# Patient Record
Sex: Male | Born: 1980 | Race: White | Hispanic: No | Marital: Married | Smoking: Never smoker
Health system: Southern US, Community
[De-identification: ages and names within clinical notes are randomized; demographics above are authoritative.]

## PROBLEM LIST (undated history)

## (undated) DIAGNOSIS — I10 Essential (primary) hypertension: Secondary | ICD-10-CM

## (undated) DIAGNOSIS — I509 Heart failure, unspecified: Secondary | ICD-10-CM

## (undated) DIAGNOSIS — Z95810 Presence of automatic (implantable) cardiac defibrillator: Secondary | ICD-10-CM

## (undated) DIAGNOSIS — Q203 Discordant ventriculoarterial connection: Secondary | ICD-10-CM

## (undated) HISTORY — PX: CHOLECYSTECTOMY: SHX55

## (undated) HISTORY — PX: CARDIAC DEFIBRILLATOR PLACEMENT: SHX171

---

## 1998-08-21 ENCOUNTER — Emergency Department (HOSPITAL_COMMUNITY): Admission: EM | Admit: 1998-08-21 | Discharge: 1998-08-21 | Payer: Self-pay | Admitting: Emergency Medicine

## 2001-07-04 ENCOUNTER — Emergency Department (HOSPITAL_COMMUNITY): Admission: EM | Admit: 2001-07-04 | Discharge: 2001-07-04 | Payer: Self-pay | Admitting: *Deleted

## 2002-08-10 ENCOUNTER — Ambulatory Visit (HOSPITAL_COMMUNITY): Admission: RE | Admit: 2002-08-10 | Discharge: 2002-08-10 | Payer: Self-pay | Admitting: Cardiovascular Disease

## 2002-08-10 ENCOUNTER — Encounter: Payer: Self-pay | Admitting: Cardiovascular Disease

## 2002-11-10 ENCOUNTER — Encounter: Payer: Self-pay | Admitting: Emergency Medicine

## 2002-11-10 ENCOUNTER — Emergency Department (HOSPITAL_COMMUNITY): Admission: EM | Admit: 2002-11-10 | Discharge: 2002-11-10 | Payer: Self-pay | Admitting: Emergency Medicine

## 2004-06-29 ENCOUNTER — Observation Stay (HOSPITAL_COMMUNITY): Admission: AD | Admit: 2004-06-29 | Discharge: 2004-06-30 | Payer: Self-pay | Admitting: General Surgery

## 2004-06-29 ENCOUNTER — Emergency Department (HOSPITAL_COMMUNITY): Admission: EM | Admit: 2004-06-29 | Discharge: 2004-06-29 | Payer: Self-pay | Admitting: Emergency Medicine

## 2005-12-12 ENCOUNTER — Emergency Department (HOSPITAL_COMMUNITY): Admission: EM | Admit: 2005-12-12 | Discharge: 2005-12-12 | Payer: Self-pay | Admitting: Emergency Medicine

## 2006-01-02 ENCOUNTER — Emergency Department (HOSPITAL_COMMUNITY): Admission: EM | Admit: 2006-01-02 | Discharge: 2006-01-02 | Payer: Self-pay | Admitting: Emergency Medicine

## 2006-03-03 ENCOUNTER — Other Ambulatory Visit: Payer: Self-pay

## 2006-03-03 ENCOUNTER — Emergency Department: Payer: Self-pay | Admitting: Emergency Medicine

## 2006-07-18 ENCOUNTER — Encounter (HOSPITAL_COMMUNITY): Admission: RE | Admit: 2006-07-18 | Discharge: 2006-08-17 | Payer: Self-pay | Admitting: Pediatrics

## 2006-08-22 ENCOUNTER — Encounter (HOSPITAL_COMMUNITY): Admission: RE | Admit: 2006-08-22 | Discharge: 2006-09-21 | Payer: Self-pay | Admitting: Pediatrics

## 2006-09-23 ENCOUNTER — Encounter (HOSPITAL_COMMUNITY): Admission: RE | Admit: 2006-09-23 | Discharge: 2006-09-26 | Payer: Self-pay | Admitting: Pediatrics

## 2006-09-28 ENCOUNTER — Encounter (HOSPITAL_COMMUNITY): Admission: RE | Admit: 2006-09-28 | Discharge: 2006-10-28 | Payer: Self-pay | Admitting: Pediatrics

## 2006-11-14 ENCOUNTER — Encounter (HOSPITAL_COMMUNITY): Admission: RE | Admit: 2006-11-14 | Discharge: 2006-12-14 | Payer: Self-pay | Admitting: Pediatrics

## 2006-12-12 ENCOUNTER — Encounter (HOSPITAL_COMMUNITY): Admission: RE | Admit: 2006-12-12 | Discharge: 2007-01-11 | Payer: Self-pay | Admitting: Pediatrics

## 2007-06-05 ENCOUNTER — Encounter (HOSPITAL_COMMUNITY): Admission: RE | Admit: 2007-06-05 | Discharge: 2007-06-27 | Payer: Self-pay | Admitting: Pediatrics

## 2008-01-15 ENCOUNTER — Encounter (HOSPITAL_COMMUNITY): Admission: RE | Admit: 2008-01-15 | Discharge: 2008-02-14 | Payer: Self-pay | Admitting: Pediatrics

## 2009-01-24 ENCOUNTER — Emergency Department (HOSPITAL_COMMUNITY): Admission: EM | Admit: 2009-01-24 | Discharge: 2009-01-24 | Payer: Self-pay | Admitting: Emergency Medicine

## 2009-02-17 ENCOUNTER — Encounter (HOSPITAL_COMMUNITY): Admission: RE | Admit: 2009-02-17 | Discharge: 2009-03-19 | Payer: Self-pay | Admitting: Pediatrics

## 2011-02-12 NOTE — Consult Note (Signed)
Brett Marquez, Brett Marquez                ACCOUNT NO.:  [id number]   MEDICAL RECORD NO.:  [id number]          PATIENT TYPE:  INP   LOCATION:  A215                          FACILITY:  [facility]   PHYSICIAN:  Edward L. Juanetta Gosling, M.D.DATE OF BIRTH:  1980-10-01   DATE OF CONSULTATION:  DATE OF DISCHARGE:                                   CONSULTATION   REASON FOR CONSULTATION:  Cardiac disease.   HISTORY:  Brett Marquez is a 30 year old who has a long, long history of cardiac  problems.  He is actually admitted now for right upper quadrant pain,  cholecystitis, and cholelithiasis and is being set up for surgery.  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  He has had a pacemaker  implanted.  He has a duo-chamber pacemaker at this time.   PAST MEDICAL HISTORY:  1.  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  2.  Hypertension.  3.  He has had a pacemaker for many years.  He has had a recent battery      replacement.   MEDICATIONS:  His only medication is Lotensin 10 mg daily.   ALLERGIES:  No known drug allergies.   FAMILY HISTORY:  Positive for apparently some cardiac disease but not other  congenital heart abnormalities.   REVIEW OF SYSTEMS:  Except as mentioned is essentially negative.  He has had  his pacemaker checked.  He does not have any nausea.  He has had some  abdominal discomfort as mentioned, because of his cholecystitis.   PHYSICAL EXAMINATION:  GENERAL:  He is a well-developed well-nourished male  who is moderately obese and in no acute distress.  He does complain of some  right upper quadrant pain.  VITAL SIGNS:  Temperature is 97.8, pulse is 78, respirations 20, blood  pressure 128/74.  Height 73 inches, weight 277.  CHEST:  Clear without wheezes.  HEART:  Regular in what appears to be a paced rhythm.  ABDOMEN:  Soft.  EXTREMITIES:  Showed  no edema.   LABORATORY DATA:  White count 7700, hemoglobin is 13.8, hematocrit 40.2.  His electrolytes are normal.  Albumin is 3.4, amylase 63.  Urine is  negative.  He has had an ultrasound, which shows cholecystitis and  cholelithiasis.   ASSESSMENT:  1.  He has cholecystitis and cholelithiasis.  2.  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  3.  Hypertension.   PLAN:  He is okay for the planned surgery.     Edwa   ELH/MEDQ  D:  06/30/2004  T:  06/30/2004  Job:  [id number]

## 2011-02-12 NOTE — Op Note (Signed)
Brett Marquez, Brett Marquez                ACCOUNT NO.:  [id number]   MEDICAL RECORD NO.:  [id number]          PATIENT TYPE:  INP   LOCATION:  A215                          FACILITY:  [facility]   PHYSICIAN:  Dalia Heading, M.D.  DATE OF BIRTH:  08-May-1981   DATE OF PROCEDURE:  06/30/2004  DATE OF DISCHARGE:                                 OPERATIVE REPORT   PREOPERATIVE DIAGNOSIS:  Cholecystitis, cholelithiasis.   POSTOPERATIVE DIAGNOSIS:  Cholecystitis, cholelithiasis.   PROCEDURE:  Laparoscopic cholecystectomy.   SURGEON:  Dalia Heading, M.D.   ASSISTANT:  Bernerd Limbo. Leona Carry, M.D.   ANESTHESIA:  General endotracheal.   INDICATIONS:  The patient is a 30 year old white male who presents with  cholecystitis secondary to cholelithiasis.  The risks and benefits of the  procedure, including bleeding, infection, hepatobiliary injury, and the  possibility of an open procedure, were fully explained to the patient, who  gave informed consent.   PROCEDURE NOTE:  The patient was placed in the supine position.  After  induction of general endotracheal anesthesia, the abdomen was prepped and  draped using the usual sterile technique with Betadine.  Surgical site  confirmation was performed.   A supraumbilical incision was made down to the fascia.  A Veress needle was  introduced into the abdominal cavity and confirmation of placement was done  using the saline drop test.  The abdomen was then insufflated to 16 mmHg  pressure.  An 11 mm trocar was introduced into the abdominal cavity under  direct visualization without difficulty.  An additional 11 mm trocar was  placed in the epigastric region and 5 mm trocars were placed in the right  upper quadrant and right flank regions.  The liver was inspected and noted  to be within normal limits.  The gallbladder was retracted superior and  laterally.  The dissection was begun around the infundibulum of the  gallbladder.  The cystic duct was first  identified, its juncture to the  infundibulum fully identified.  Endoclips were placed proximally and  distally on the cystic duct and the cystic duct was divided.  This was  likewise done with the cystic artery.  The gallbladder was then freed away  from the gallbladder fossa using Bovie electrocautery.  The gallbladder was  delivered through the epigastric trocar site using an EndoCatch bag.  The  gallbladder fossa was inspected and no abnormal bleeding or bile leakage was  noted.  Surgicel was placed in the gallbladder fossa.  All fluid and air  were then evacuated from the abdominal cavity prior to the removal of the  trocars.   All wounds were irrigated with normal saline.  All wounds were injected with  0.5% Sensorcaine.  The supraumbilical fascia as well as epigastric fascia  were reapproximated using an 0 Vicryl interrupted suture.  All skin  incisions were closed using staples.  Betadine ointment and dry sterile  dressings were applied.   All tape and needle counts were correct at the end of the procedure.  The  patient was extubated in the operating room and went back to  the recovery  room awake, in stable condition.   COMPLICATIONS:  None.   SPECIMENS:  Gallbladder with stones.   ESTIMATED BLOOD LOSS:  Minimal.      MAJ/MEDQ  D:  06/30/2004  T:  06/30/2004  Job:  [id number]   cc:   Ramon Dredge L. Juanetta Gosling, M.D.  [address]  [place]  [place] [address]  Fax: [phone]

## 2011-02-12 NOTE — H&P (Signed)
NAMEBRYLER, Brett Marquez                ACCOUNT NO.:  [id number]   MEDICAL RECORD NO.:  [id number]          PATIENT TYPE:  OUT   LOCATION:  RAD                           FACILITY:  [facility]   PHYSICIAN:  Dalia Heading, M.D.  DATE OF BIRTH:  09-05-1981   DATE OF ADMISSION:  06/29/2004  DATE OF DISCHARGE:  LH                                HISTORY & PHYSICAL   CHIEF COMPLAINT:  1.  Cholecystitis.  2.  Cholelithiasis.   HISTORY OF PRESENT ILLNESS:  The patient is a 30 year old white male who was  in his usual state of health when he presented to the emergency room late  last night with right upper quadrant abdominal pain.  He underwent an  ultrasound of the gallbladder today which reveals acute cholecystitis with  gallbladder wall thickening and cholelithiasis.  The common bile duct was  reportedly within normal limits.  The patient now presents to the hospital  for intravenous antibiotics and preparation for surgery.   PAST MEDICAL HISTORY:  1.  Transposition of the great vessels.  2.  Hypertension.   PAST SURGICAL HISTORY:  1.  Correction of his transposition of the great vessels as an infant.  2.  Recent pacemaker battery placement approximately two years ago, dual      chamber lead.   CURRENT MEDICATIONS:  Lotensin 10 mg p.o. daily.   ALLERGIES:  No known drug allergies.   REVIEW OF SYSTEMS:  The patient states that he did a phone check of his  pacemaker one month ago and it was reportedly normal.  He is followed by Dr.  Juanetta Gosling and [facility].   PHYSICAL EXAMINATION:  GENERAL APPEARANCE:  The patient is a well-developed,  well-nourished, white male in no acute distress.  HEENT:  Examination reveals no scleral icterus.  LUNGS:  Clear to auscultation with equal breath sounds bilaterally.  HEART:  Regular rate and rhythm without S3, S4 or murmurs.  ABDOMEN:  Soft with tenderness down the right upper quadrant to palpation.  No hepatosplenomegaly, masses or hernias  are identified.   IMPRESSION:  1.  Cholecystitis.  2.  Cholelithiasis.   PLAN:  The patient will be admitted to the hospital for intravenous  antibiotic therapy and further workup.  He is to undergo laparoscopic  cholecystectomy in the a.m.  The risks and benefits of the procedure,  including bleeding, infection, hepatobiliary injury and the possibility of  an open procedure were fully explained to the patient, who gave informed  consent.      MAJ/MEDQ  D:  06/29/2004  T:  06/29/2004  Job:  [id number]   cc:   Ramon Dredge L. Juanetta Gosling, M.D.  [address]  [place]  [place] [address]  Fax: [phone]

## 2011-08-10 DIAGNOSIS — I484 Atypical atrial flutter: Secondary | ICD-10-CM | POA: Insufficient documentation

## 2011-08-10 DIAGNOSIS — I441 Atrioventricular block, second degree: Secondary | ICD-10-CM | POA: Insufficient documentation

## 2011-08-10 DIAGNOSIS — Z95810 Presence of automatic (implantable) cardiac defibrillator: Secondary | ICD-10-CM | POA: Insufficient documentation

## 2014-05-07 DIAGNOSIS — I509 Heart failure, unspecified: Secondary | ICD-10-CM | POA: Insufficient documentation

## 2014-09-09 ENCOUNTER — Emergency Department (HOSPITAL_COMMUNITY): Payer: Medicare Other

## 2014-09-09 ENCOUNTER — Encounter (HOSPITAL_COMMUNITY): Payer: Self-pay | Admitting: Emergency Medicine

## 2014-09-09 ENCOUNTER — Emergency Department (HOSPITAL_COMMUNITY)
Admission: EM | Admit: 2014-09-09 | Discharge: 2014-09-09 | Disposition: A | Discharge status: Home / Self Care | Payer: Medicare Other | Attending: Emergency Medicine | Admitting: Emergency Medicine

## 2014-09-09 DIAGNOSIS — Z95810 Presence of automatic (implantable) cardiac defibrillator: Secondary | ICD-10-CM | POA: Diagnosis not present

## 2014-09-09 DIAGNOSIS — R079 Chest pain, unspecified: Secondary | ICD-10-CM | POA: Diagnosis present

## 2014-09-09 DIAGNOSIS — R071 Chest pain on breathing: Secondary | ICD-10-CM | POA: Insufficient documentation

## 2014-09-09 DIAGNOSIS — R52 Pain, unspecified: Secondary | ICD-10-CM

## 2014-09-09 HISTORY — DX: Presence of automatic (implantable) cardiac defibrillator: Z95.810

## 2014-09-09 HISTORY — DX: Discordant ventriculoarterial connection: Q20.3

## 2014-09-09 LAB — COMPREHENSIVE METABOLIC PANEL
ALT: 42 U/L (ref 0–53)
AST: 34 U/L (ref 0–37)
Albumin: 3.9 g/dL (ref 3.5–5.2)
Alkaline Phosphatase: 71 U/L (ref 39–117)
Anion gap: 12 (ref 5–15)
BUN: 12 mg/dL (ref 6–23)
CALCIUM: 9.7 mg/dL (ref 8.4–10.5)
CO2: 28 meq/L (ref 19–32)
Chloride: 102 mEq/L (ref 96–112)
Creatinine, Ser: 0.86 mg/dL (ref 0.50–1.35)
GLUCOSE: 103 mg/dL — AB (ref 70–99)
Potassium: 4.4 mEq/L (ref 3.7–5.3)
Sodium: 142 mEq/L (ref 137–147)
Total Bilirubin: 0.5 mg/dL (ref 0.3–1.2)
Total Protein: 7.5 g/dL (ref 6.0–8.3)

## 2014-09-09 LAB — CBC WITH DIFFERENTIAL/PLATELET
BASOS ABS: 0 10*3/uL (ref 0.0–0.1)
Basophils Relative: 0 % (ref 0–1)
EOS PCT: 7 % — AB (ref 0–5)
Eosinophils Absolute: 0.5 10*3/uL (ref 0.0–0.7)
HCT: 42 % (ref 39.0–52.0)
Hemoglobin: 14.7 g/dL (ref 13.0–17.0)
LYMPHS ABS: 2.3 10*3/uL (ref 0.7–4.0)
Lymphocytes Relative: 38 % (ref 12–46)
MCH: 29.9 pg (ref 26.0–34.0)
MCHC: 35 g/dL (ref 30.0–36.0)
MCV: 85.4 fL (ref 78.0–100.0)
MONO ABS: 0.5 10*3/uL (ref 0.1–1.0)
Monocytes Relative: 9 % (ref 3–12)
Neutro Abs: 2.8 10*3/uL (ref 1.7–7.7)
Neutrophils Relative %: 46 % (ref 43–77)
PLATELETS: 177 10*3/uL (ref 150–400)
RBC: 4.92 MIL/uL (ref 4.22–5.81)
RDW: 13.3 % (ref 11.5–15.5)
WBC: 6.1 10*3/uL (ref 4.0–10.5)

## 2014-09-09 LAB — TROPONIN I

## 2014-09-09 LAB — D-DIMER, QUANTITATIVE (NOT AT ARMC)

## 2014-09-09 MED ORDER — TRAMADOL HCL 50 MG PO TABS: ORAL_TABLET | ORAL | Status: DC

## 2014-09-09 MED ORDER — IOHEXOL 300 MG/ML  SOLN
100.0000 mL | Freq: Once | INTRAMUSCULAR | Status: AC | PRN
  Administered 2014-09-09: 100 mL via INTRAVENOUS

## 2014-09-09 MED ORDER — MORPHINE SULFATE 4 MG/ML IJ SOLN
4.0000 mg | Freq: Once | INTRAMUSCULAR | Status: AC
  Administered 2014-09-09: 4 mg via INTRAVENOUS
  Filled 2014-09-09: qty 1

## 2014-09-09 NOTE — ED Notes (Signed)
Pt reports onset of L sided chest pain. Pt describes pain as "sharp, tearing pain." Pt has hx of defibrillator implant.

## 2014-09-09 NOTE — ED Provider Notes (Signed)
CSN: [id number]     Arrival date & time 09/09/14  [id number] History   This chart was scribed for Brett LennertJoseph L Allyiah Gartner, MD by Brett LionSuzanne Marquez, ED Scribe. This patient was seen in room APA10/APA10 and the patient's care was started at 7:58 AM.    Chief Complaint  Patient presents with  . Chest Pain    Patient is a 33 y.o. male presenting with chest pain.  Chest Pain Pain location:  L chest Pain quality: sharp   Duration:  2 days Timing:  Intermittent Progression:  Unchanged Worsened by:  Deep breathing Associated symptoms: no abdominal pain, no back pain, no cough, no fatigue and no headache   Risk factors: surgery (defibrillator implant)    HPI Comments: Brett Marquez is a 33 y.o. male who presents to the Emergency Department complaining of intermittent, sharp, "stretchy" left-sided chest pain that will sometimes stop for 5-10 seconds at a time and began 2 days ago. Patient has a history of defibrillator implant and says his pain "feels like it's behind the defibrillator." Deep inspiration triggers the pain. [facility] who said that the pain could be due to scar tissue from patient's surgeries.   No past medical history on file. No past surgical history on file. No family history on file. History  Substance Use Topics  . Smoking status: Not on file  . Smokeless tobacco: Not on file  . Alcohol Use: Not on file    Review of Systems  Constitutional: Negative for appetite change and fatigue.  HENT: Negative for congestion, ear discharge and sinus pressure.   Eyes: Negative for discharge.  Respiratory: Negative for cough.   Cardiovascular: Positive for chest pain.  Gastrointestinal: Negative for abdominal pain and diarrhea.  Genitourinary: Negative for frequency and hematuria.  Musculoskeletal: Negative for back pain.  Skin: Negative for rash.  Neurological: Negative for seizures and headaches.  Psychiatric/Behavioral: Negative for hallucinations.      Allergies   Review of patient's allergies indicates not on file.  Home Medications   Prior to Admission medications   Not on File   BP 144/86 mmHg  Pulse 72  Temp(Src) 97.6 F (36.4 C) (Oral)  Resp 16  Ht 6\' 1"  (1.854 m)  Wt 300 lb (136.079 kg)  BMI 39.59 kg/m2  SpO2 98% Physical Exam  Constitutional: He is oriented to person, place, and time. He appears well-developed.  HENT:  Head: Normocephalic.  Eyes: Conjunctivae and EOM are normal. No scleral icterus.  Neck: Neck supple. No thyromegaly present.  Cardiovascular: Normal rate and regular rhythm.  Exam reveals no gallop and no friction rub.   No murmur heard. Pulmonary/Chest: No stridor. He has no wheezes. He has no rales. He exhibits no tenderness.  Abdominal: He exhibits no distension. There is no tenderness. There is no rebound.  Musculoskeletal: Normal range of motion. He exhibits no edema.  Lymphadenopathy:    He has no cervical adenopathy.  Neurological: He is oriented to person, place, and time. He exhibits normal muscle tone. Coordination normal.  Skin: No rash noted. No erythema.  Well-healed scar over left upper chest.  Psychiatric: He has a normal mood and affect. His behavior is normal.  Nursing note and vitals reviewed.   ED Course  Procedures (including critical care time)  DIAGNOSTIC STUDIES: Oxygen Saturation is 98% on RA, normal by my interpretation.    COORDINATION OF CARE: 8:00 AM - Discussed treatment plan with pt at bedside which includes several tests and  pt agreed to plan.   Labs Review Labs Reviewed - No data to display  Imaging Review No results found.   EKG Interpretation   Date/Time:  Monday September 09 2014 07:53:14 EST Ventricular Rate:  70 PR Interval:    QRS Duration: 167 QT Interval:  502 QTC Calculation: 542 R Axis:   -88 Text Interpretation:  Accelerated junctional rhythm Right bundle branch  block Probable inferior infarct, acute Anteroseptal infarct, age  indeterminate  Lateral leads are also involved Confirmed by Brett Soderman  MD,  Brett Marquez [phone]([id number]) on 09/09/2014 12:59:43 PM      MDM   Final diagnoses:  None   Noncardiac chest pain.   tx with ultram and follow up with pcp  The chart was scribed for me under my direct supervision.  I personally performed the history, physical, and medical decision making and all procedures in the evaluation of this patient.Brett Marquez.    Brett Weida L Max Nuno, MD 09/09/14 [id number]

## 2014-09-09 NOTE — Discharge Instructions (Signed)
Follow up with your md in one week for recheck °

## 2016-04-13 IMAGING — CR DG CHEST 2V
2 series · 2 of 2 positions shown · non-contrast
Comparison: 09/09/2014

CLINICAL DATA: Left chest/back pain, mild shortness of breath x1
day

EXAM:
CHEST  2 VIEW

[view not recorded (1 of 2)]
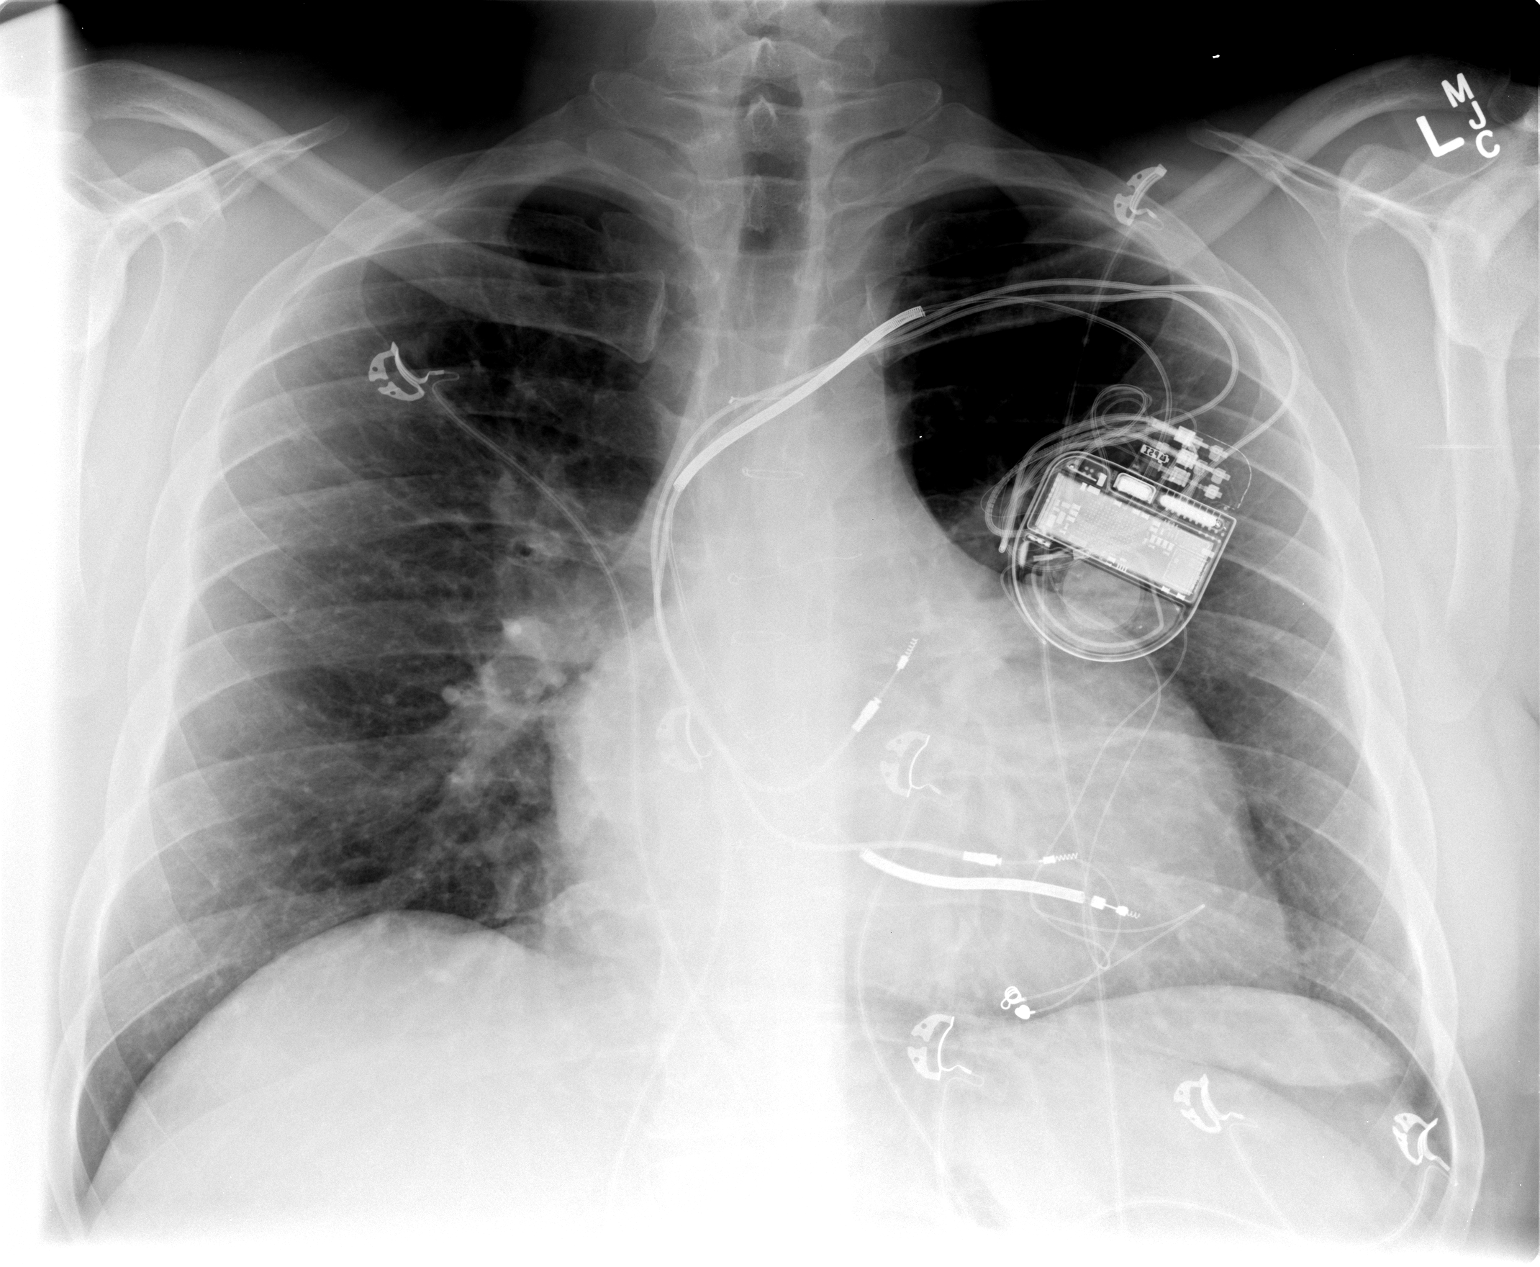

[view not recorded (2 of 2)]
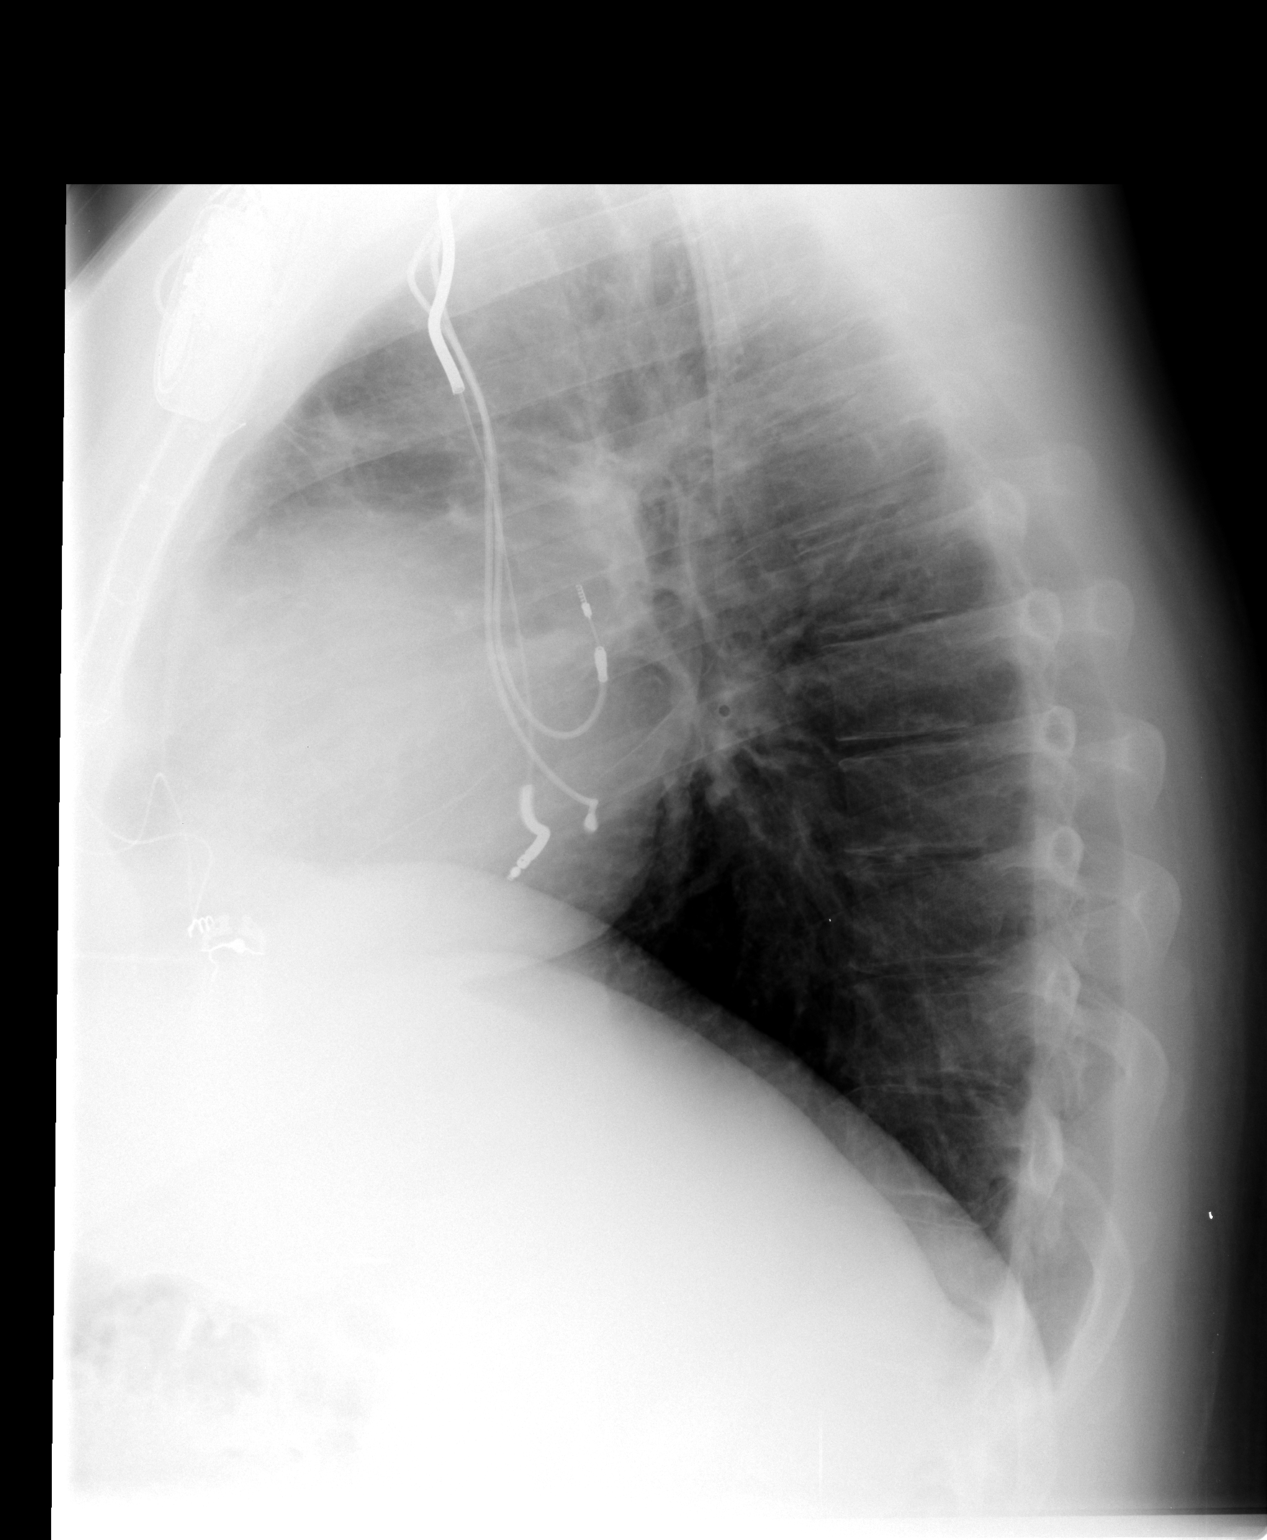

[2 of 2 positions shown; findings below may reference images not displayed]

FINDINGS: Mild patchy retrosternal opacity on the lateral view. Possible mild
central right upper lobe opacity on the frontal radiograph. Although
equivocal, this appearance at least raises the possibility of mild
right upper lobe pneumonia. No pleural effusion or pneumothorax.

Cardiomegaly.  Left subclavian ICD.

Visualized osseous structures are within normal limits.
IMPRESSION: Patchy retrosternal opacity on the lateral view, possibly reflecting
right upper lobe pneumonia.

## 2016-04-13 IMAGING — CR DG CHEST 1V PORT
1 series · 1 of 1 positions shown · non-contrast
Comparison: None.

CLINICAL DATA: One day history of left-sided chest and back pain
with shortness of breath; history of congenital heart disease;
implantable cardiac defibrillator.

EXAM:
PORTABLE CHEST - 1 VIEW

[portable]
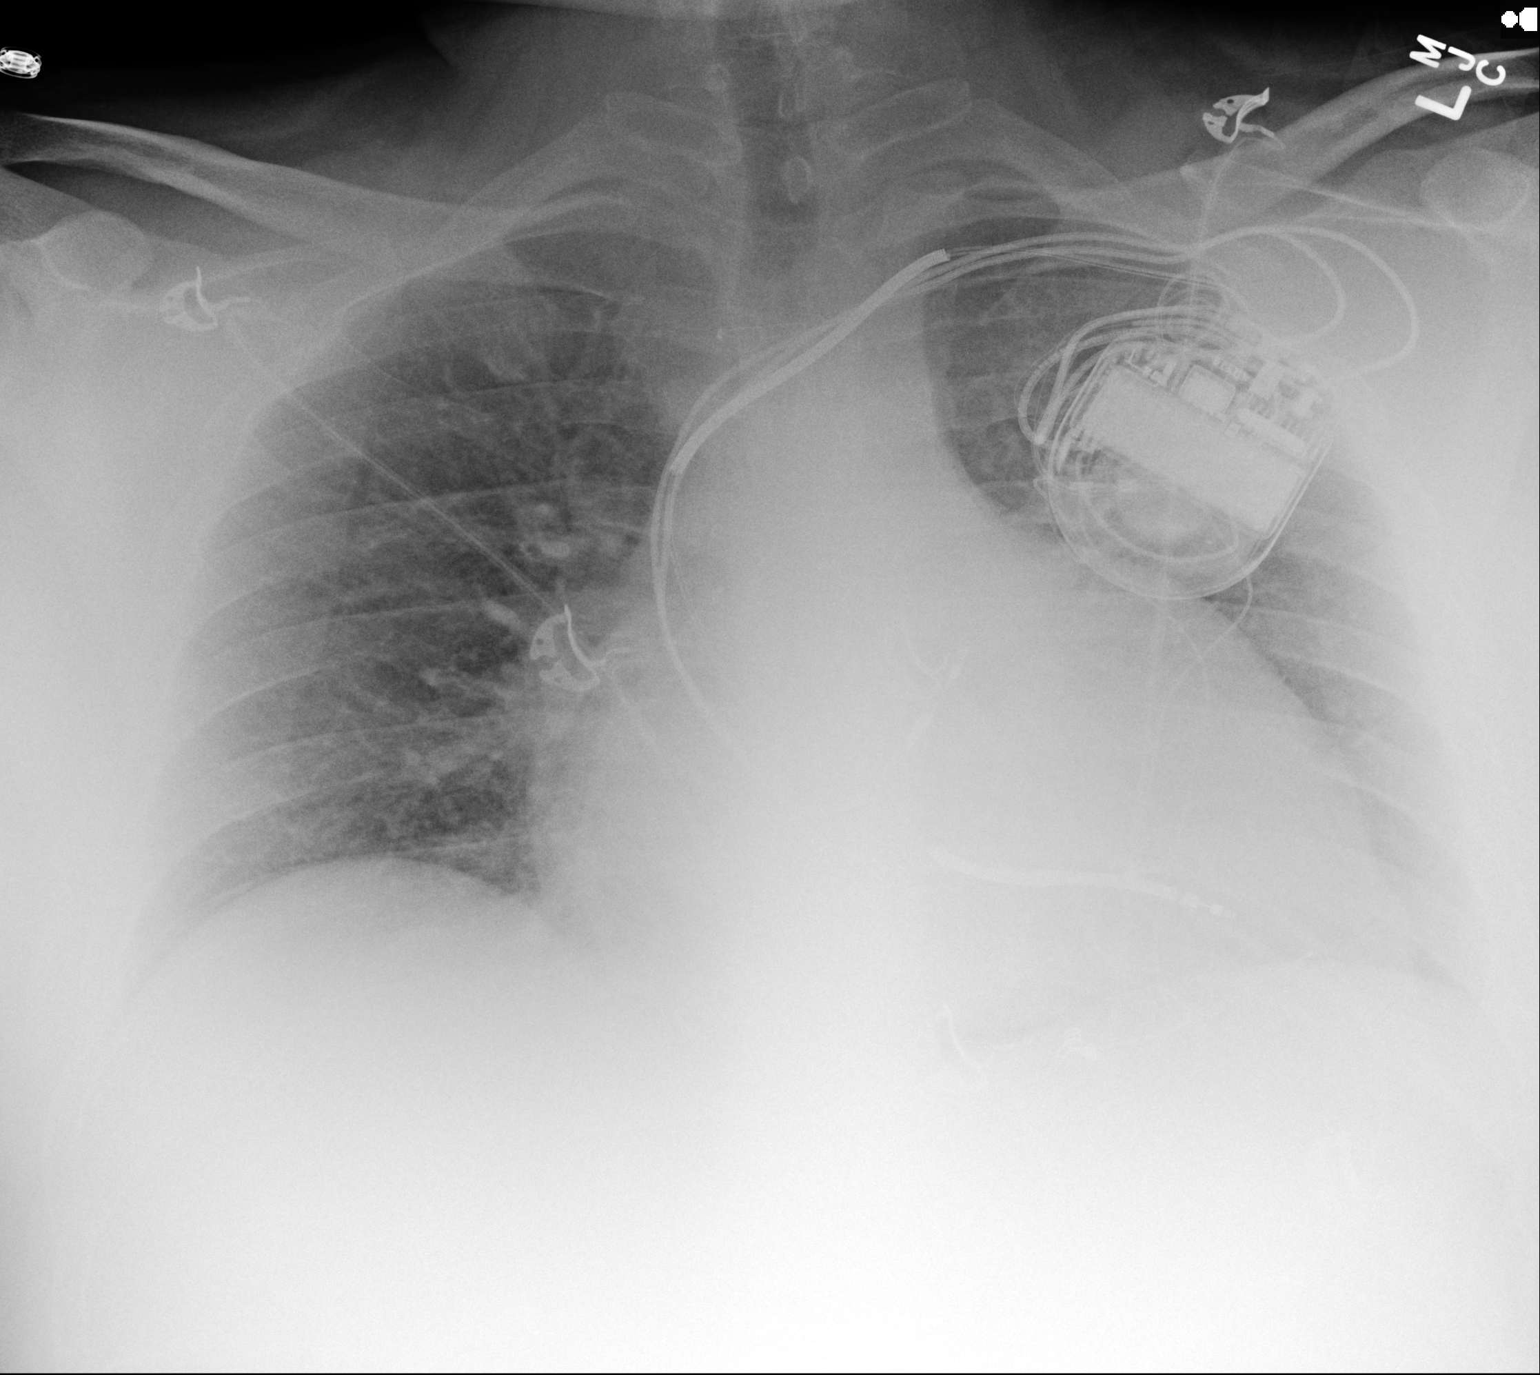

[1 of 1 positions shown; findings below may reference images not displayed]

FINDINGS: The cardiopericardial silhouette is enlarged. The pulmonary
interstitial markings are minimally prominent but the study is
limited due to bilateral hypoinflation. The pulmonary vascularity is
not clearly engorged. The trachea is midline. There is no pleural
effusion or pneumothorax. The permanent pacemaker/ defibrillator is
in appropriate position.
IMPRESSION: Enlargement of the cardiac silhouette may rib be related to chamber
enlargement muscular hypertrophy, or pericardial effusion. There is
no significant pulmonary edema. There is no evidence of pneumonia.

A followup PA and lateral chest x-ray with deep inspiration would be
useful.

## 2016-04-13 IMAGING — CT CT CHEST W/ CM
2 of 3 series · 15 of 36 positions shown, 18 images · IV contrast (omnipaque)
Comparison: Chest x-ray from earlier today. CT chest from
05/23/2013.

CLINICAL DATA: Subsequent encounter for left chest and back pain
with mild shortness of Breath lasting 1 day. Question right upper
lobe pneumonia on chest x-ray earlier today.

EXAM:
CT CHEST WITH CONTRAST
TECHNIQUE: Multidetector CT imaging of the chest was performed during
intravenous contrast administration.
CONTRAST:  100mL OMNIPAQUE IOHEXOL 300 MG/ML  SOLN

[Series 2: chestroutine 5.0 b40f · axial · 0.82mm/px · z∈[-396,-106]mm · 12 of 68 slices shown, 15 images]
[im 5/68  mediastinal]
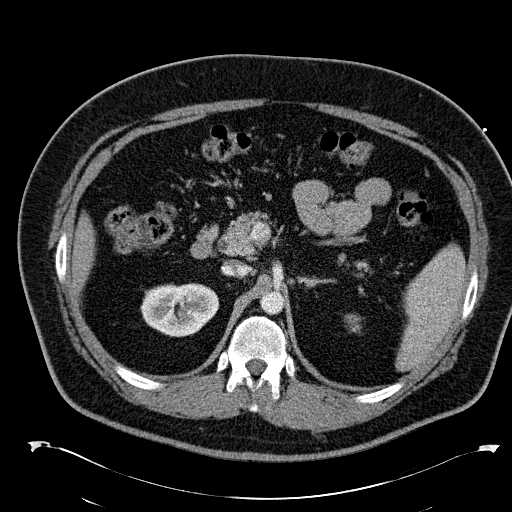
[im 5/68  lung]
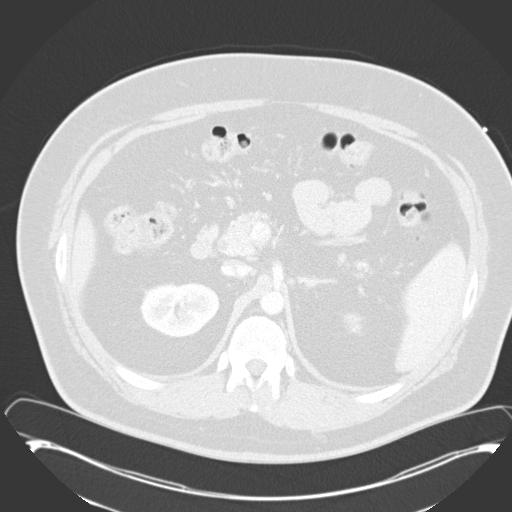
[im 10/68  lung]
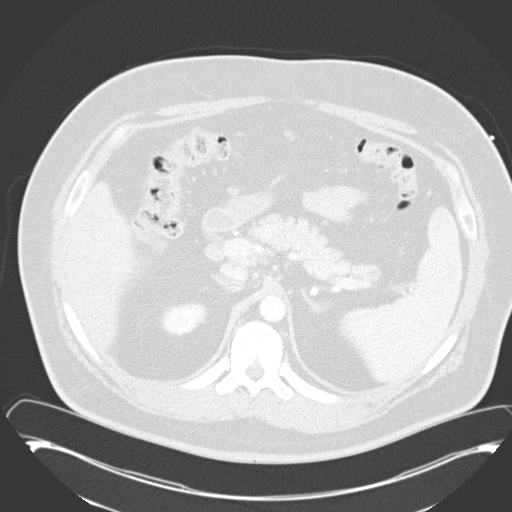
[im 15/68  lung]
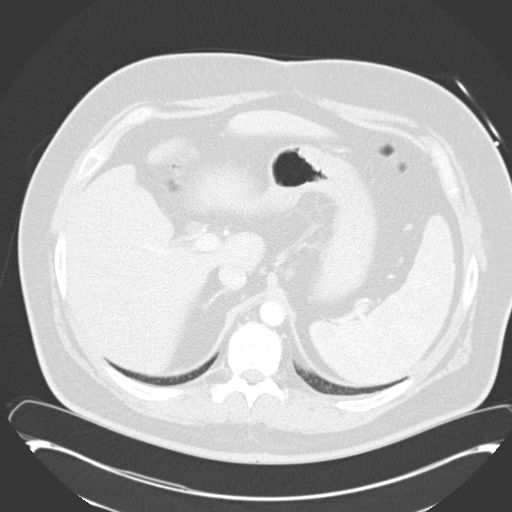
[im 20/68  lung]
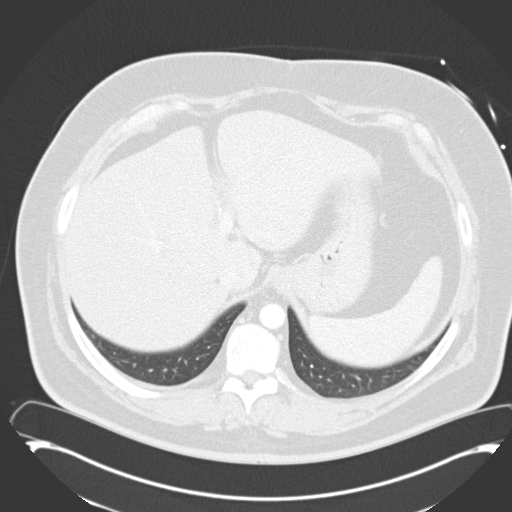
[im 25/68  mediastinal]
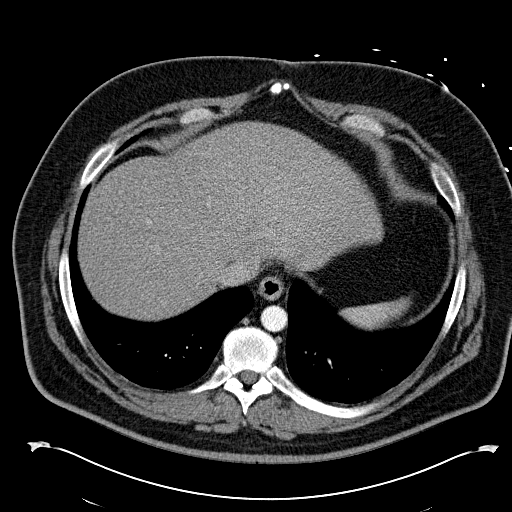
[im 25/68  lung]
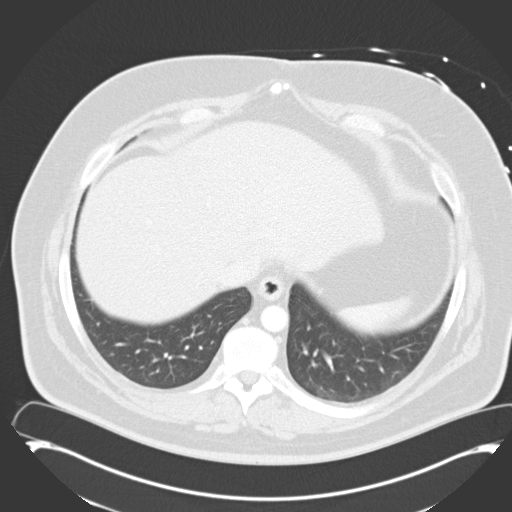
[im 30/68  lung]
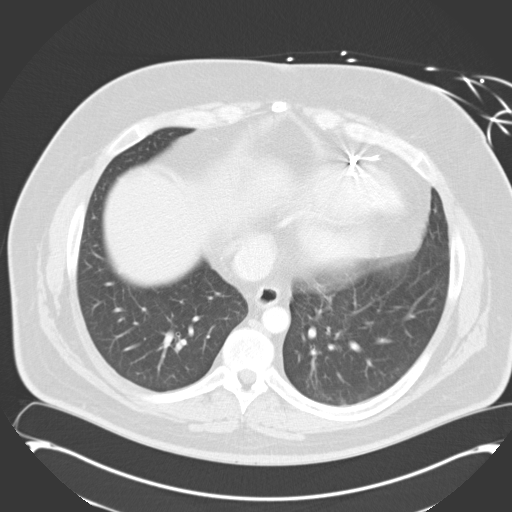
[im 38/68  lung]
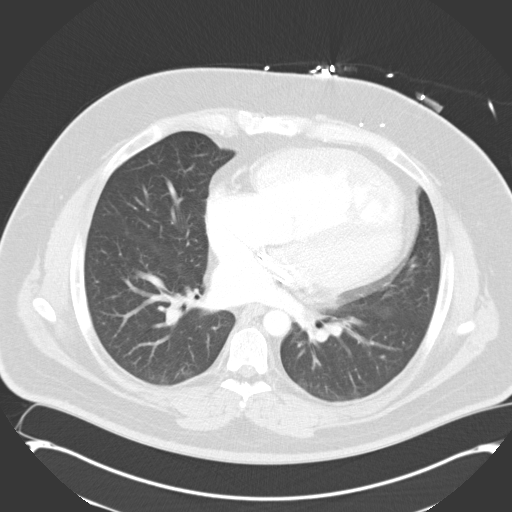
[im 43/68  lung]
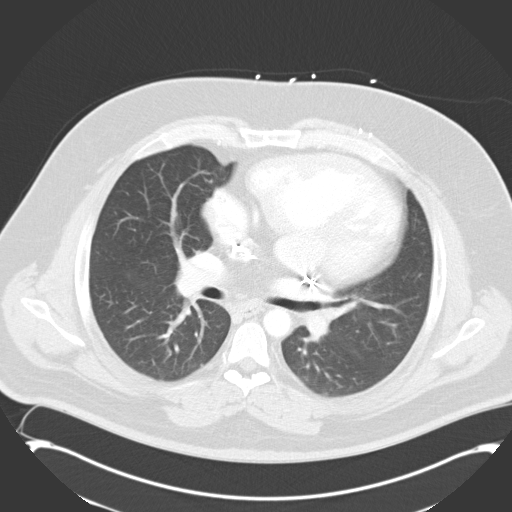
[im 48/68  mediastinal]
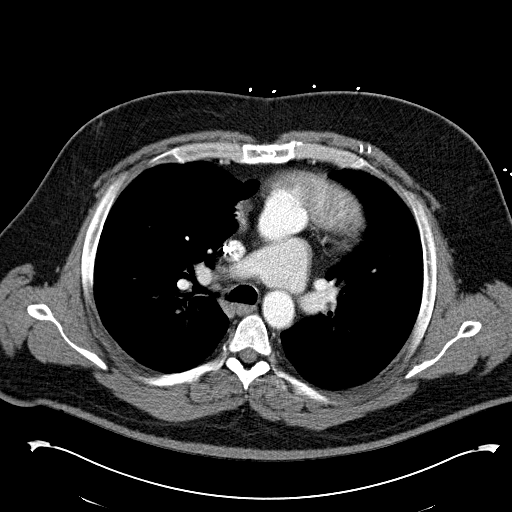
[im 48/68  lung]
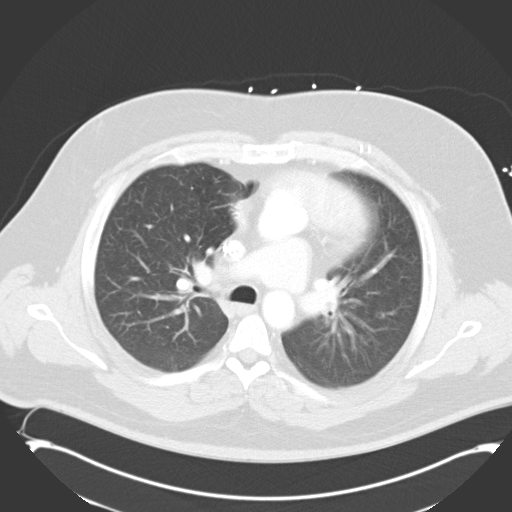
[im 53/68  lung]
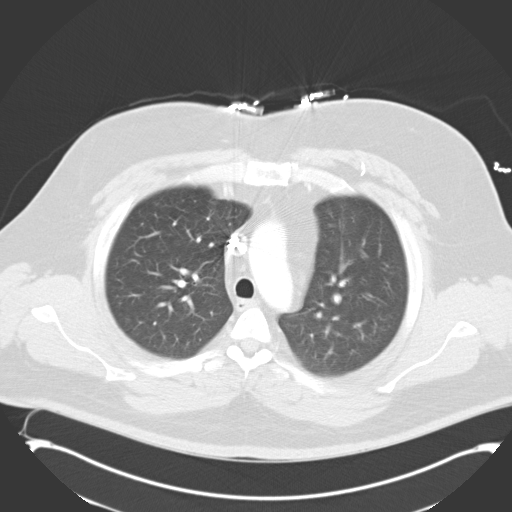
[im 58/68  lung]
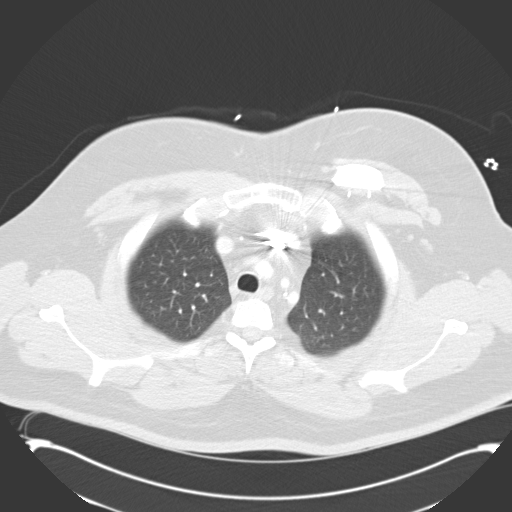
[im 63/68  lung]
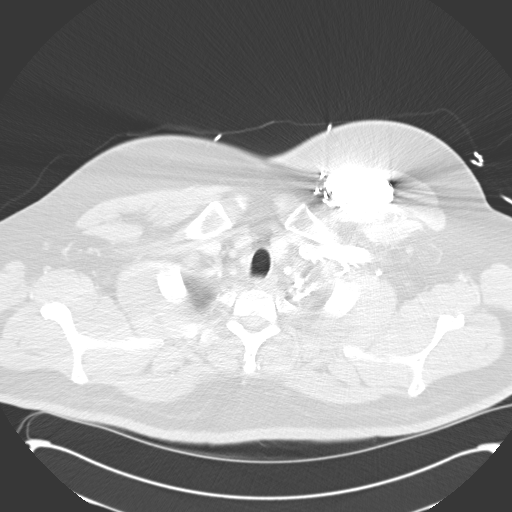

[Series 4: mpr coronal chest 3mm · coronal · 0.71mm/px · 3 of 106 slices shown]
[im 22/106  lung]
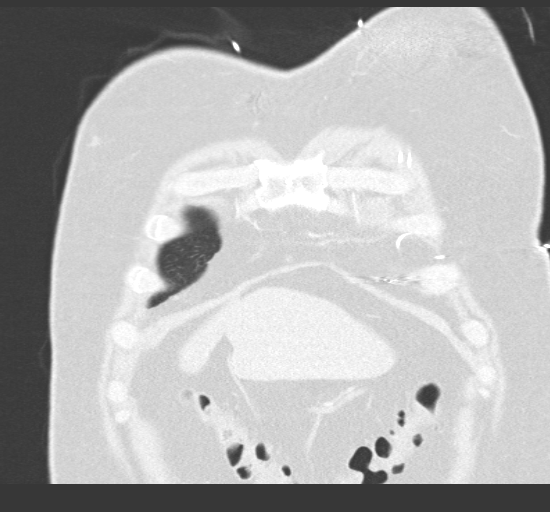
[im 43/106  lung]
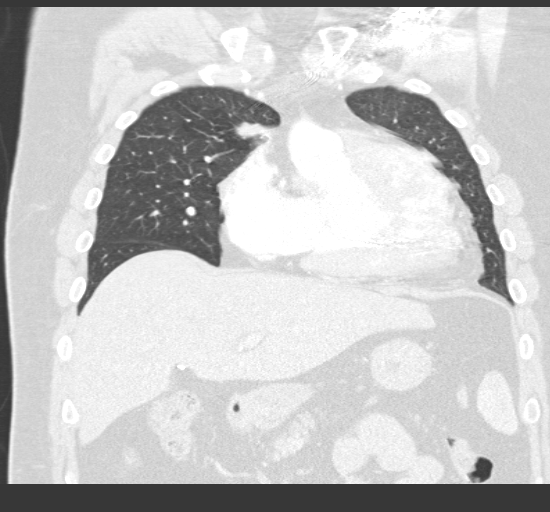
[im 64/106  lung]
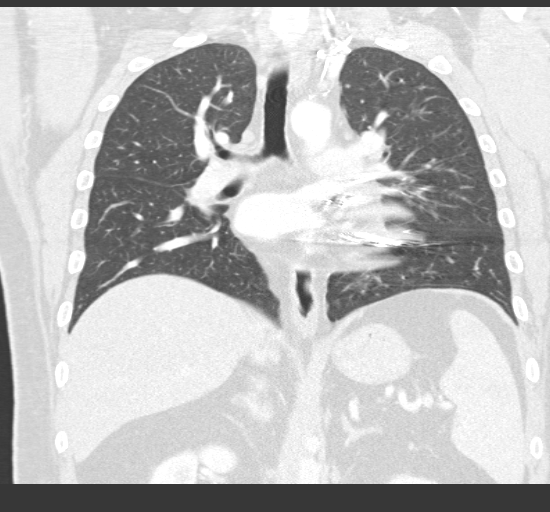

[15 of 36 positions shown; findings below may reference images not displayed]

FINDINGS: Soft tissue / Mediastinum: There is no axillary lymphadenopathy.
Small lymph nodes are seen in the mediastinum without pathologic
enlargement. There is no hilar lymphadenopathy. Cardiac and great
vessel anatomy consistent with reported history of transposition of
the great vessels. No pericardial effusion. Left-sided permanent
pacemaker identified.

Lungs / Pleura: Focal area of apparent peripheral scarring is seen
in the medial right upper lobe, extending to the medial pleura.
Right lung is otherwise clear. 5 mm lingular nodule is seen on image
24. Left lung otherwise clear. No pleural effusion on in the solid.

Bones: Bone windows reveal no worrisome lytic or sclerotic osseous
lesions.

Upper Abdomen:  Unremarkable
IMPRESSION: Chest x-ray abnormality from earlier today represents an area of
apparent focal scarring in the medial right upper lobe, as the
appearance is completely stable since 05/23/2013.

Otherwise unremarkable exam.

## 2017-06-27 ENCOUNTER — Emergency Department (HOSPITAL_COMMUNITY)
Admission: EM | Admit: 2017-06-27 | Discharge: 2017-06-27 | Disposition: A | Discharge status: Home / Self Care | Payer: Medicare Other | Attending: Emergency Medicine | Admitting: Emergency Medicine

## 2017-06-27 ENCOUNTER — Encounter (HOSPITAL_COMMUNITY): Payer: Self-pay | Admitting: Emergency Medicine

## 2017-06-27 ENCOUNTER — Emergency Department (HOSPITAL_COMMUNITY): Payer: Medicare Other

## 2017-06-27 DIAGNOSIS — I509 Heart failure, unspecified: Secondary | ICD-10-CM | POA: Diagnosis not present

## 2017-06-27 DIAGNOSIS — Z79899 Other long term (current) drug therapy: Secondary | ICD-10-CM | POA: Insufficient documentation

## 2017-06-27 DIAGNOSIS — R0602 Shortness of breath: Secondary | ICD-10-CM | POA: Diagnosis present

## 2017-06-27 LAB — COMPREHENSIVE METABOLIC PANEL
ALT: 37 U/L (ref 17–63)
AST: 33 U/L (ref 15–41)
Albumin: 4.1 g/dL (ref 3.5–5.0)
Alkaline Phosphatase: 74 U/L (ref 38–126)
Anion gap: 11 (ref 5–15)
BUN: 10 mg/dL (ref 6–20)
CO2: 25 mmol/L (ref 22–32)
CREATININE: 0.84 mg/dL (ref 0.61–1.24)
Calcium: 9.3 mg/dL (ref 8.9–10.3)
Chloride: 107 mmol/L (ref 101–111)
GFR calc Af Amer: >60 mL/min (ref >60)
GFR calc non Af Amer: >60 mL/min (ref >60)
GLUCOSE: 108 mg/dL — AB (ref 65–99)
Potassium: 3.5 mmol/L (ref 3.5–5.1)
SODIUM: 143 mmol/L (ref 135–145)
Total Bilirubin: 1.5 mg/dL — ABNORMAL HIGH (ref 0.3–1.2)
Total Protein: 7.7 g/dL (ref 6.5–8.1)

## 2017-06-27 LAB — CBC WITH DIFFERENTIAL/PLATELET
BASOS ABS: 0 10*3/uL (ref 0.0–0.1)
BASOS PCT: 0 %
EOS ABS: 0.1 10*3/uL (ref 0.0–0.7)
EOS PCT: 1 %
HCT: 39.2 % (ref 39.0–52.0)
Hemoglobin: 13.6 g/dL (ref 13.0–17.0)
LYMPHS PCT: 17 %
Lymphs Abs: 1.3 10*3/uL (ref 0.7–4.0)
MCH: 29.5 pg (ref 26.0–34.0)
MCHC: 34.7 g/dL (ref 30.0–36.0)
MCV: 85 fL (ref 78.0–100.0)
Monocytes Absolute: 0.4 10*3/uL (ref 0.1–1.0)
Monocytes Relative: 6 %
Neutro Abs: 5.8 10*3/uL (ref 1.7–7.7)
Neutrophils Relative %: 76 %
Platelets: 152 10*3/uL (ref 150–400)
RBC: 4.61 MIL/uL (ref 4.22–5.81)
RDW: 13.4 % (ref 11.5–15.5)
WBC: 7.6 10*3/uL (ref 4.0–10.5)

## 2017-06-27 LAB — TROPONIN I
Troponin I: 0.03 ng/mL (ref <0.03)
Troponin I: 0.03 ng/mL (ref <0.03)

## 2017-06-27 LAB — BRAIN NATRIURETIC PEPTIDE: B Natriuretic Peptide: 332 pg/mL — ABNORMAL HIGH (ref 0.0–100.0)

## 2017-06-27 LAB — MAGNESIUM: Magnesium: 1.2 mg/dL — ABNORMAL LOW (ref 1.7–2.4)

## 2017-06-27 MED ORDER — ACETAMINOPHEN 500 MG PO TABS
1000.0000 mg | ORAL_TABLET | Freq: Once | ORAL | Status: AC
  Administered 2017-06-27: 1000 mg via ORAL
  Filled 2017-06-27: qty 2

## 2017-06-27 MED ORDER — FUROSEMIDE 10 MG/ML IJ SOLN
80.0000 mg | Freq: Once | INTRAMUSCULAR | Status: AC
  Administered 2017-06-27: 80 mg via INTRAVENOUS
  Filled 2017-06-27: qty 8

## 2017-06-27 MED ORDER — FUROSEMIDE 20 MG PO TABS: 20.0000 mg | ORAL_TABLET | Freq: Every day | ORAL | 0 refills | Status: DC

## 2017-06-27 MED ORDER — POTASSIUM CHLORIDE ER 20 MEQ PO TBCR: 20.0000 meq | EXTENDED_RELEASE_TABLET | Freq: Two times a day (BID) | ORAL | 0 refills | Status: DC

## 2017-06-27 MED ORDER — MAGNESIUM SULFATE 2 GM/50ML IV SOLN
2.0000 g | Freq: Once | INTRAVENOUS | Status: AC
  Administered 2017-06-27: 2 g via INTRAVENOUS
  Filled 2017-06-27: qty 50

## 2017-06-27 MED ORDER — POTASSIUM CHLORIDE CRYS ER 20 MEQ PO TBCR
40.0000 meq | EXTENDED_RELEASE_TABLET | Freq: Once | ORAL | Status: AC
  Administered 2017-06-27: 40 meq via ORAL
  Filled 2017-06-27: qty 2

## 2017-06-27 MED ORDER — ENALAPRILAT 1.25 MG/ML IV SOLN
1.2500 mg | Freq: Once | INTRAVENOUS | Status: AC
  Administered 2017-06-27: 1.25 mg via INTRAVENOUS
  Filled 2017-06-27: qty 2

## 2017-06-27 NOTE — ED Notes (Signed)
Date and time results received: 06/27/17 6:29 PM  (use smartphrase ".now" to insert current time)  Test: Troponin Critical Value: 0.03  Name of Provider Notified: Fayrene Fearing  Orders Received? Or Actions Taken?: Orders Received - See Orders for details

## 2017-06-27 NOTE — ED Triage Notes (Signed)
Pt c/o cough and sob since last night. Blood tinged sputum per pt. Sob worse with lying down. No swelling noted.

## 2017-06-27 NOTE — ED Provider Notes (Addendum)
[facility] Provider Note   CSN: [id number] Arrival date & time: 06/27/17  1515     History   Chief Complaint Chief Complaint  Patient presents with  . Shortness of Breath    HPI Brett Marquez is a 36 y.o. male. 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 Occasionally takes Lasix for congestion or for leg swelling.  He is supposed to take it daily. He states it made him feel "dehydrated". Has never been given a formal diagnosis of CHF. For the last 2 days he's had a cough. He is coughing up some clear sputum with occasional "red stuff". He is uncertain if it is blood. He has no chest pain. He said no palpitations. He's had no AICD shocks. He is followed at [facility] cardiology. No recent illness with fever or chills. No lower extremity swelling. No joint pain.  Past Medical History:  Diagnosis Date  . Cardiac defibrillator in place   . Transposition great arteries     There are no active problems to display for this patient.   Past Surgical History:  Procedure Laterality Date  . CARDIAC DEFIBRILLATOR PLACEMENT    . CHOLECYSTECTOMY         Home Medications    Prior to Admission medications   Medication Sig Start Date End Date Taking? Authorizing Provider  benazepril (LOTENSIN) 20 MG tablet Take 20 mg by mouth daily.   Yes [provider]  carvedilol (COREG) 3.125 MG tablet Take 3.125 mg by mouth 2 (two) times daily with a meal.   Yes [provider]  omeprazole (PRILOSEC) 20 MG capsule Take 20 mg by mouth daily. 07/31/14  Yes [provider]  SOTALOL AF 160 MG TABS Take 160 mg by mouth 2 (two) times daily. 08/07/14  Yes [provider]  furosemide (LASIX) 20 MG tablet Take 1  tablet (20 mg total) by mouth daily. 06/27/17   Rolland Porter, MD  potassium chloride 20 MEQ TBCR Take 20 mEq by mouth 2 (two) times daily. 06/27/17   Rolland Porter, MD    Family History History reviewed. No pertinent family history.  Social History Social History  Substance Use Topics  . Smoking status: Never Smoker  . Smokeless tobacco: Former Neurosurgeon  . Alcohol use No     Allergies   Hydrocodone-acetaminophen   Review of Systems Review of Systems  Constitutional: Negative for appetite change, chills, diaphoresis, fatigue and fever.  HENT: Negative for mouth sores, sore throat and trouble swallowing.   Eyes: Negative for visual disturbance.  Respiratory: Positive for cough and shortness of breath. Negative for chest tightness and wheezing.   Cardiovascular: Negative for chest pain.       He has describes symptoms worse with laying supine and was short of breath and sitting up last night.  Gastrointestinal: Negative for abdominal distention, abdominal pain, diarrhea, nausea and vomiting.  Endocrine: Negative for polydipsia, polyphagia and polyuria.  Genitourinary: Negative for dysuria, frequency and hematuria.  Musculoskeletal: Negative for gait problem.  Skin: Negative for color change, pallor and rash.  Neurological: Negative for dizziness, syncope, light-headedness and headaches.  Hematological: Does not bruise/bleed easily.  Psychiatric/Behavioral: Negative for behavioral problems and confusion.     Physical Exam Updated Vital Signs BP (!) 152/86  Pulse 79   Temp 98 F (36.7 C)   Resp 14   Ht  (1.854 m)   Wt (!) 137.6 kg (303 lb 6.4 oz)   SpO2 94%   BMI 40.03 kg/m   Physical Exam  Constitutional: He is oriented to person, place, and time. He appears well-developed and well-nourished. No distress.  Large stature, 303 3 pound male. He does not appear distressed. His saturations are 95% on room air  HENT:  Head: Normocephalic.  Eyes: Pupils are equal, round,  and reactive to light. Conjunctivae are normal. No scleral icterus.  Neck: Normal range of motion. Neck supple. No thyromegaly present.  Cardiovascular: Normal rate and regular rhythm.  Exam reveals no gallop and no friction rub.   No murmur heard. Systolic murmur.  Pulmonary/Chest: Effort normal and breath sounds normal. No respiratory distress. He has no wheezes. He has no rales.  His lungs are clear. No increased work of breathing. No wheezing rales or rhonchi. He has a very large stature and his exam is somewhat limited by his habitus  Abdominal: Soft. Bowel sounds are normal. He exhibits no distension. There is no tenderness. There is no rebound.  Genitourinary:  Genitourinary Comments: No lower extremity edema  Musculoskeletal: Normal range of motion.  Neurological: He is alert and oriented to person, place, and time.  Skin: Skin is warm and dry. No rash noted.  Psychiatric: He has a normal mood and affect. His behavior is normal.     ED Treatments / Results  Labs (all labs ordered are listed, but only abnormal results are displayed) Labs Reviewed  COMPREHENSIVE METABOLIC PANEL - Abnormal; Notable for the following:       Result Value   Glucose, Bld 108 (*)    Total Bilirubin 1.5 (*)    All other components within normal limits  BRAIN NATRIURETIC PEPTIDE - Abnormal; Notable for the following:    B Natriuretic Peptide 332.0 (*)    All other components within normal limits  TROPONIN I - Abnormal; Notable for the following:    Troponin I 0.03 (*)    All other components within normal limits  MAGNESIUM - Abnormal; Notable for the following:    Magnesium 1.2 (*)    All other components within normal limits  CBC WITH DIFFERENTIAL/PLATELET  TROPONIN I    EKG   Radiology Dg Chest 2 View  Result Date: 06/27/2017 CLINICAL DATA:  Initial evaluation for acute cough and shortness of breath. EXAM: CHEST  2 VIEW COMPARISON:  Prior radiograph from 09/09/2017. FINDINGS: Left-sided  pacemaker/AICD. Advanced cardiomegaly, similar to previous. Mediastinal silhouette normal. Lungs normally inflated. Diffuse vascular congestion with interstitial prominence, suggesting mild diffuse pulmonary interstitial edema. No pleural effusion. No focal infiltrates. No pneumothorax. No acute osseus abnormality. IMPRESSION: Cardiomegaly with diffuse vascular and interstitial prominence, suggesting mild pulmonary interstitial edema. Electronically Signed   By: Rise Mu M.D.   On: 06/27/2017 15:44    Procedures Procedures (including critical care time)  Medications Ordered in ED Medications  magnesium sulfate IVPB 2 g 50 mL (not administered)  acetaminophen (TYLENOL) tablet 1,000 mg (not administered)  furosemide (LASIX) injection 80 mg (80 mg Intravenous Given 06/27/17 2100)  potassium chloride SA (K-DUR,KLOR-CON) CR tablet 40 mEq (40 mEq Oral Given 06/27/17 2047)  enalaprilat (VASOTEC) injection 1.25 mg (1.25 mg Intravenous Given 06/27/17 2053)     Initial Impression / Assessment and Plan / ED Course  I have reviewed the triage vital signs and the nursing notes.  Pertinent labs & imaging results that were available during my care of the patient were reviewed by me and considered in my medical decision making (see chart for details).    Echo from [facility] 10/2016:    VEINS NORMAL IVC SIZE AND SOME IVC COLLAPSE  ATRIA: SUPERIOR LIMB OF SYSTEMIC BAFFLE SEEN WITH LAMINAR FLOW SUGGESTING PATENT VESSEL INFERIOR LIMB OF SYSTEMIC BAFFLE APPEARS PATENT  ATRIOVENTRICULAR VALVES: MILD SYSTEMIC AV VALVE REGURGITATION (TRICUSPID) NO VENOUS AV VALVE REGURGITATION (MITRAL)  VENTRICLES: SEVERELY ENLARGED SYSTEMIC VENTRICLE (RIGHT VENTRICLE) WITH SEVERE DYSFUNCTION SMALL VENOUS VENTRICLE (LV) WITH NORMAL FUNCTION PACEMAKER LEADS SEEN IN THE LV AND LA  SEMILUNAR VALVES: NO AORTIC REGURGITATION TRIVIAL PULMONARY VALVE REGURGITATION  COMPARED  WITH PRIOR TTE FROM 02/03/2016, NO SIGNIFICANT CHANGES   most recent echo it was unchanged. It does show marked dysfunction of his RV, which is actually his functional systemic ventricle. Intermittent CHF symptoms would not be unexpected.  Chest x-ray shows interstitial prominence and perhaps early CHF. No alveolar edema. Mild elevation of BNP. Troponin 0.03 upper limits normal. Plan is repeat troponin. I do not suspect that this is STEMI he has no pain is otherwise young and healthy and has no risk for coronary artery disease. He has used Lasix intermittently in the past his given IV Lasix and a dose of IV ACEI here. A second troponin normal or same and he diuresis and feels well think he could be safe to be discharged home. Attention to his electrolytes he was given K Turner here will replete magnesium lest he get Low Mg++ and or hypokalemic with an AICD. Will have him take regular dose of Lasix with potassium if appropriate for discharge at that time  Final Clinical Impressions(s) / ED Diagnoses   Final diagnoses:  Other congestive heart failure (HCC)   22:17: Patient's blood pressures improved 130/85. His diuresis 1800. Exam of the room he is laying supine, sleep. Oxygen 98%. He awakens easily. He states he feels "100% better. I discussed with him maintaining his morning dose of Lasix until he sees his primary cardiologist next week. Daily potassium supplement as well.   New Prescriptions New Prescriptions   FUROSEMIDE (LASIX) 20 MG TABLET    Take 1 tablet (20 mg total) by mouth daily.   POTASSIUM CHLORIDE 20 MEQ TBCR    Take 20 mEq by mouth 2 (two) times daily.     Rolland Porter, MD 06/27/17 2147    Rolland Porter, MD 06/27/17 5643    Rolland Porter, MD 06/27/17 2218

## 2017-06-27 NOTE — Discharge Instructions (Signed)
Take Lasix each morning as directed with your potassium. Return to ER with any new or worsening symptoms. Keep your appointment with [facility] cardiology as scheduled.

## 2019-10-06 ENCOUNTER — Other Ambulatory Visit: Payer: Self-pay

## 2019-10-06 ENCOUNTER — Ambulatory Visit
Admission: EM | Admit: 2019-10-06 | Discharge: 2019-10-06 | Disposition: A | Discharge status: Home / Self Care | Payer: Medicare Other | Attending: Emergency Medicine | Admitting: Emergency Medicine

## 2019-10-06 DIAGNOSIS — Z20822 Contact with and (suspected) exposure to covid-19: Secondary | ICD-10-CM

## 2019-10-06 DIAGNOSIS — J069 Acute upper respiratory infection, unspecified: Secondary | ICD-10-CM

## 2019-10-06 HISTORY — DX: Essential (primary) hypertension: I10

## 2019-10-06 MED ORDER — BENZONATATE 100 MG PO CAPS: 100.0000 mg | ORAL_CAPSULE | Freq: Three times a day (TID) | ORAL | 0 refills | Status: DC

## 2019-10-06 MED ORDER — CETIRIZINE HCL 10 MG PO TABS: 10.0000 mg | ORAL_TABLET | Freq: Every day | ORAL | 0 refills | Status: DC

## 2019-10-06 MED ORDER — FLUTICASONE PROPIONATE 50 MCG/ACT NA SUSP: 2.0000 | Freq: Every day | NASAL | 0 refills | Status: DC

## 2019-10-06 NOTE — ED Triage Notes (Signed)
Pt presents to UC w/ c/o sob, cough, loss of smell x1 week. Pt has had 2 positive covid exposures this week.

## 2019-10-06 NOTE — Discharge Instructions (Signed)

## 2019-10-06 NOTE — ED Provider Notes (Signed)
[facility]   [id number] 10/06/19 Arrival Time: 2585   CC: COVID symptoms  SUBJECTIVE: History from: patient.  Brett Marquez is a 39 y.o. male who presents with SOB, non productive cough, loss of smell and taste, nausea x 1 week.  Admits to positive exposure at work.  Denies recent travel.  Has tried NOT tried OTC medications.  Denies aggravating factors.  Denies previous symptoms in the past.  Denies sinus pain, rhinorrhea, sore throat, wheezing, chest pain, changes in bowel or bladder habits.    ROS: As per HPI.  All other pertinent ROS negative.     Past Medical History:  Diagnosis Date  . Cardiac defibrillator in place   . Hypertension   . Transposition great arteries    Past Surgical History:  Procedure Laterality Date  . CARDIAC DEFIBRILLATOR PLACEMENT    . CHOLECYSTECTOMY     Allergies  Allergen Reactions  . Hydrocodone-Acetaminophen Nausea And Vomiting   No current facility-administered medications on file prior to encounter.   Current Outpatient Medications on File Prior to Encounter  Medication Sig Dispense Refill  . carvedilol (COREG) 3.125 MG tablet Take 3.125 mg by mouth 2 (two) times daily with a meal.    . furosemide (LASIX) 20 MG tablet Take 1 tablet (20 mg total) by mouth daily. 10 tablet 0  . omeprazole (PRILOSEC) 20 MG capsule Take 20 mg by mouth daily.  1  . SOTALOL AF 160 MG TABS Take 160 mg by mouth 2 (two) times daily.    . [DISCONTINUED] benazepril (LOTENSIN) 20 MG tablet Take 20 mg by mouth daily.    . [DISCONTINUED] potassium chloride 20 MEQ TBCR Take 20 mEq by mouth 2 (two) times daily. 10 tablet 0   Social History   Socioeconomic History  . Marital status: Married    Spouse name: Not on file  . Number of children: Not on file  . Years of education: Not on file  . Highest education level: Not on file  Occupational History  . Not on file  Tobacco Use  . Smoking status: Never Smoker  . Smokeless tobacco: Former Network engineer  and Sexual Activity  . Alcohol use: No  . Drug use: No  . Sexual activity: Not on file  Other Topics Concern  . Not on file  Social History Narrative  . Not on file   Social Determinants of Health   Financial Resource Strain:   . Difficulty of Paying Living Expenses: Not on file  Food Insecurity:   . Worried About Charity fundraiser in the Last Year: Not on file  . Ran Out of Food in the Last Year: Not on file  Transportation Needs:   . Lack of Transportation (Medical): Not on file  . Lack of Transportation (Non-Medical): Not on file  Physical Activity:   . Days of Exercise per Week: Not on file  . Minutes of Exercise per Session: Not on file  Stress:   . Feeling of Stress : Not on file  Social Connections:   . Frequency of Communication with Friends and Family: Not on file  . Frequency of Social Gatherings with Friends and Family: Not on file  . Attends Religious Services: Not on file  . Active Member of Clubs or Organizations: Not on file  . Attends Archivist Meetings: Not on file  . Marital Status: Not on file  Intimate Partner Violence:   . Fear of Current or Ex-Partner: Not on file  .  Emotionally Abused: Not on file  . Physically Abused: Not on file  . Sexually Abused: Not on file   Family History  Problem Relation Age of Onset  . Healthy Mother   . Healthy Father     OBJECTIVE:  Vitals:   10/06/19 1159  BP: (!) 142/98  Pulse: 74  Resp: 18  Temp: 98 F (36.7 C)  TempSrc: Oral  SpO2: 96%     General appearance: alert; appears fatigued, but nontoxic; speaking in full sentences and tolerating own secretions HEENT: NCAT; Ears: EACs clear, TMs pearly gray; Eyes: PERRL.  EOM grossly intact. Nose: nares patent without rhinorrhea, Throat: oropharynx clear, tonsils non erythematous or enlarged, uvula midline  Neck: supple without LAD Lungs: unlabored respirations, symmetrical air entry; cough: mild; no respiratory distress; CTAB Heart: regular rate  and rhythm.   Skin: warm and dry Psychological: alert and cooperative; normal mood and affect  ASSESSMENT & PLAN:  1. Suspected COVID-19 virus infection   2. Viral URI with cough     Meds ordered this encounter  Medications  . benzonatate (TESSALON) 100 MG capsule    Sig: Take 1 capsule (100 mg total) by mouth every 8 (eight) hours.    Dispense:  21 capsule    Refill:  0    Order Specific Question:   Supervising Provider    Answer:   Eustace Moore [[id number]]  . cetirizine (ZYRTEC) 10 MG tablet    Sig: Take 1 tablet (10 mg total) by mouth daily.    Dispense:  30 tablet    Refill:  0    Order Specific Question:   Supervising Provider    Answer:   Eustace Moore [[id number]]  . fluticasone (FLONASE) 50 MCG/ACT nasal spray    Sig: Place 2 sprays into both nostrils daily.    Dispense:  16 g    Refill:  0    Order Specific Question:   Supervising Provider    Answer:   Eustace Moore [[id number]]   COVID testing ordered.  It will take between 5-7 days for test results.  Someone will contact you regarding abnormal results.    In the meantime: You should remain isolated in your home for 10 days from symptom onset AND greater than 72 hours after symptoms resolution (absence of fever without the use of fever-reducing medication and improvement in respiratory symptoms), whichever is longer Get plenty of rest and push fluids Tessalon Perles prescribed for cough Use OTC zyrtec for nasal congestion, runny nose, and/or sore throat Use OTC flonase for nasal congestion and runny nose Use medications daily for symptom relief Use OTC medications like ibuprofen or tylenol as needed fever or pain Call or go to the ED if you have any new or worsening symptoms such as fever, worsening cough, shortness of breath, chest tightness, chest pain, turning blue, changes in mental status, etc...   Reviewed expectations re: course of current medical issues. Questions answered. Outlined signs and  symptoms indicating need for more acute intervention. Patient verbalized understanding. After Visit Summary given.         Rennis Harding, PA-C 10/06/19 1251

## 2019-10-07 LAB — NOVEL CORONAVIRUS, NAA: SARS-CoV-2, NAA: DETECTED — AB

## 2019-10-09 ENCOUNTER — Telehealth (HOSPITAL_COMMUNITY): Payer: Self-pay | Admitting: Emergency Medicine

## 2019-10-09 NOTE — Telephone Encounter (Signed)

## 2020-05-23 ENCOUNTER — Ambulatory Visit
Admission: EM | Admit: 2020-05-23 | Discharge: 2020-05-23 | Disposition: A | Discharge status: Home / Self Care | Payer: Medicare Other | Attending: Emergency Medicine | Admitting: Emergency Medicine

## 2020-05-23 ENCOUNTER — Encounter: Payer: Self-pay | Admitting: Emergency Medicine

## 2020-05-23 ENCOUNTER — Other Ambulatory Visit: Payer: Self-pay

## 2020-05-23 DIAGNOSIS — M79642 Pain in left hand: Secondary | ICD-10-CM | POA: Diagnosis not present

## 2020-05-23 MED ORDER — ACETAMINOPHEN 500 MG PO TABS: 500.0000 mg | ORAL_TABLET | Freq: Four times a day (QID) | ORAL | 0 refills | Status: DC | PRN

## 2020-05-23 MED ORDER — PREDNISONE 10 MG (21) PO TBPK: ORAL_TABLET | ORAL | 0 refills | Status: DC

## 2020-05-23 NOTE — ED Triage Notes (Signed)
LT hand x 1 1/2 weeks. Denies any injury

## 2020-05-23 NOTE — Discharge Instructions (Addendum)
Take prednisone as prescribed and to completion Take Tylenol as needed for pain Follow-up with PCP Follow RICE instruction that is attached Return or go to ED for worsening of symptoms

## 2020-05-23 NOTE — ED Provider Notes (Signed)
[facility]   [id number] 05/23/20 Arrival Time: 1657   Chief Complaint  Patient presents with  . Hand Pain     SUBJECTIVE: History from: patient.  Brett Marquez is a 39 y.o. male who presented to the urgent care with a complaint of left hand pain for the past 10 days.  Denies any precipitating event.  Localized pain to the left hand.  Has tried OTC medication without relief.  Denies similar symptoms in the past.  Reports worsening symptoms with range of motion.  Denies trauma, injury.  Denies chills, fever, fatigue, sinus pain, rhinorrhea, sore throat, SOB, wheezing, chest pain, nausea, changes in bowel or bladder habits.     ROS: As per HPI.  All other pertinent ROS negative.      Past Medical History:  Diagnosis Date  . Cardiac defibrillator in place   . Hypertension   . Transposition great arteries    Past Surgical History:  Procedure Laterality Date  . CARDIAC DEFIBRILLATOR PLACEMENT    . CHOLECYSTECTOMY     Allergies  Allergen Reactions  . Hydrocodone-Acetaminophen Nausea And Vomiting   No current facility-administered medications on file prior to encounter.   Current Outpatient Medications on File Prior to Encounter  Medication Sig Dispense Refill  . benzonatate (TESSALON) 100 MG capsule Take 1 capsule (100 mg total) by mouth every 8 (eight) hours. 21 capsule 0  . carvedilol (COREG) 3.125 MG tablet Take 3.125 mg by mouth 2 (two) times daily with a meal.    . cetirizine (ZYRTEC) 10 MG tablet Take 1 tablet (10 mg total) by mouth daily. 30 tablet 0  . fluticasone (FLONASE) 50 MCG/ACT nasal spray Place 2 sprays into both nostrils daily. 16 g 0  . furosemide (LASIX) 20 MG tablet Take 1 tablet (20 mg total) by mouth daily. 10 tablet 0  . omeprazole (PRILOSEC) 20 MG capsule Take 20 mg by mouth daily.  1  . SOTALOL AF 160 MG TABS Take 160 mg by mouth 2 (two) times daily.    . [DISCONTINUED] benazepril (LOTENSIN) 20 MG tablet Take 20 mg by mouth daily.    .  [DISCONTINUED] potassium chloride 20 MEQ TBCR Take 20 mEq by mouth 2 (two) times daily. 10 tablet 0   Social History   Socioeconomic History  . Marital status: Married    Spouse name: Not on file  . Number of children: Not on file  . Years of education: Not on file  . Highest education level: Not on file  Occupational History  . Not on file  Tobacco Use  . Smoking status: Never Smoker  . Smokeless tobacco: Former Engineer, water and Sexual Activity  . Alcohol use: No  . Drug use: No  . Sexual activity: Not on file  Other Topics Concern  . Not on file  Social History Narrative  . Not on file   Social Determinants of Health   Financial Resource Strain:   . Difficulty of Paying Living Expenses: Not on file  Food Insecurity:   . Worried About Programme researcher, broadcasting/film/video in the Last Year: Not on file  . Ran Out of Food in the Last Year: Not on file  Transportation Needs:   . Lack of Transportation (Medical): Not on file  . Lack of Transportation (Non-Medical): Not on file  Physical Activity:   . Days of Exercise per Week: Not on file  . Minutes of Exercise per Session: Not on file  Stress:   . Feeling  of Stress : Not on file  Social Connections:   . Frequency of Communication with Friends and Family: Not on file  . Frequency of Social Gatherings with Friends and Family: Not on file  . Attends Religious Services: Not on file  . Active Member of Clubs or Organizations: Not on file  . Attends Banker Meetings: Not on file  . Marital Status: Not on file  Intimate Partner Violence:   . Fear of Current or Ex-Partner: Not on file  . Emotionally Abused: Not on file  . Physically Abused: Not on file  . Sexually Abused: Not on file   Family History  Problem Relation Age of Onset  . Healthy Mother   . Healthy Father     OBJECTIVE:  Vitals:   05/23/20 1728 05/23/20 1730  BP:  126/81  Pulse:  74  Resp:  18  Temp:  97.9 F (36.6 C)  TempSrc:  Oral  SpO2:  95%    Weight: 288 lb (130.6 kg)   Height: 6\' 1"  (1.854 m)      Physical Exam Vitals and nursing note reviewed.  Constitutional:      General: He is not in acute distress.    Appearance: Normal appearance. He is normal weight. He is not ill-appearing, toxic-appearing or diaphoretic.  Cardiovascular:     Rate and Rhythm: Normal rate and regular rhythm.     Pulses: Normal pulses.     Heart sounds: Normal heart sounds. No murmur heard.  No friction rub. No gallop.   Pulmonary:     Effort: Pulmonary effort is normal. No respiratory distress.     Breath sounds: Normal breath sounds. No stridor. No wheezing, rhonchi or rales.  Chest:     Chest wall: No tenderness.  Musculoskeletal:        General: Tenderness present.     Right hand: Normal.     Left hand: Tenderness present.     Comments: The left hand is without any obvious deformity or asymmetry when compared to the right hand.  No surface trauma, ecchymosis, open wound or lesion present.  Normal cascade of finger.  Normal range of motion with strength.  Neurovascular status intact.  Neurological:     Mental Status: He is alert and oriented to person, place, and time.     LABS:  No results found for this or any previous visit (from the past 24 hour(s)).   ASSESSMENT & PLAN:  1. Left hand pain     Meds ordered this encounter  Medications  . predniSONE (STERAPRED UNI-PAK 21 TAB) 10 MG (21) TBPK tablet    Sig: Take 6 tabs by mouth daily  for 1 days, then 5 tabs for 1 days, then 4 tabs for 1 days, then 3 tabs for 1 days, 2 tabs for 1 days, then 1 tab by mouth daily for 1 days    Dispense:  21 tablet    Refill:  0  . acetaminophen (TYLENOL) 500 MG tablet    Sig: Take 1 tablet (500 mg total) by mouth every 6 (six) hours as needed.    Dispense:  30 tablet    Refill:  0    Discharge instructions Take prednisone as prescribed and to completion Take Tylenol as needed for pain Follow-up with PCP Follow RICE instruction that is  attached Return or go to ED for worsening of symptoms  Reviewed expectations re: course of current medical issues. Questions answered. Outlined signs and symptoms indicating need for  more acute intervention. Patient verbalized understanding. After Visit Summary given.     Note: This document was prepared using Dragon voice recognition software and may include unintentional dictation errors.     Durward Parcel, FNP 05/23/20 1811

## 2021-11-19 ENCOUNTER — Other Ambulatory Visit (HOSPITAL_COMMUNITY)
Admission: RE | Admit: 2021-11-19 | Discharge: 2021-11-19 | Disposition: A | Discharge status: Home / Self Care | Payer: Medicare Other | Source: Ambulatory Visit | Attending: Cardiology | Admitting: Cardiology

## 2021-11-19 DIAGNOSIS — T82110D Breakdown (mechanical) of cardiac electrode, subsequent encounter: Secondary | ICD-10-CM | POA: Diagnosis present

## 2021-11-19 DIAGNOSIS — Y828 Other medical devices associated with adverse incidents: Secondary | ICD-10-CM | POA: Insufficient documentation

## 2021-11-19 LAB — CBC
HCT: 39.9 % (ref 39.0–52.0)
Hemoglobin: 13.9 g/dL (ref 13.0–17.0)
MCH: 29.9 pg (ref 26.0–34.0)
MCHC: 34.8 g/dL (ref 30.0–36.0)
MCV: 85.8 fL (ref 80.0–100.0)
Platelets: 136 10*3/uL — ABNORMAL LOW (ref 150–400)
RBC: 4.65 MIL/uL (ref 4.22–5.81)
RDW: 13.9 % (ref 11.5–15.5)
WBC: 4.6 10*3/uL (ref 4.0–10.5)
nRBC: 0 % (ref 0.0–0.2)

## 2021-11-19 LAB — SEDIMENTATION RATE: Sed Rate: 7 mm/hr (ref 0–16)

## 2021-11-22 LAB — HM HEPATITIS C SCREENING LAB: HM Hepatitis Screen: NEGATIVE

## 2021-11-29 ENCOUNTER — Encounter: Payer: Self-pay | Admitting: Emergency Medicine

## 2021-11-29 ENCOUNTER — Ambulatory Visit
Admission: EM | Admit: 2021-11-29 | Discharge: 2021-11-29 | Disposition: A | Discharge status: Home / Self Care | Payer: Medicare Other

## 2021-11-29 ENCOUNTER — Other Ambulatory Visit: Payer: Self-pay

## 2021-11-29 DIAGNOSIS — Z5189 Encounter for other specified aftercare: Secondary | ICD-10-CM

## 2021-11-29 NOTE — ED Provider Notes (Signed)
?[facility] ? ? ?MRN: [id number] DOB: 07/21/1981 ? ?Subjective:  ? ?Brett Marquez is a 41 y.o. male presenting for wound care.  Patient was seen and had a procedure done for surgical removal of the cyst over the left upper chest.  Has been tending to his wound at home with the help of his friend who is a Engineer, civil (consulting).  He is supposed to go to wound care but has not been able to secure an appointment.  No fever, drainage of pus or bleeding. ? ?No current facility-administered medications for this encounter. ? ?Current Outpatient Medications:  ?  acetaminophen (TYLENOL) 500 MG tablet, Take 1 tablet (500 mg total) by mouth every 6 (six) hours as needed., Disp: 30 tablet, Rfl: 0 ?  benzonatate (TESSALON) 100 MG capsule, Take 1 capsule (100 mg total) by mouth every 8 (eight) hours., Disp: 21 capsule, Rfl: 0 ?  carvedilol (COREG) 3.125 MG tablet, Take 3.125 mg by mouth 2 (two) times daily with a meal., Disp: , Rfl:  ?  cetirizine (ZYRTEC) 10 MG tablet, Take 1 tablet (10 mg total) by mouth daily., Disp: 30 tablet, Rfl: 0 ?  fluticasone (FLONASE) 50 MCG/ACT nasal spray, Place 2 sprays into both nostrils daily., Disp: 16 g, Rfl: 0 ?  furosemide (LASIX) 20 MG tablet, Take 1 tablet (20 mg total) by mouth daily., Disp: 10 tablet, Rfl: 0 ?  omeprazole (PRILOSEC) 20 MG capsule, Take 20 mg by mouth daily., Disp: , Rfl: 1 ?  predniSONE (STERAPRED UNI-PAK 21 TAB) 10 MG (21) TBPK tablet, Take 6 tabs by mouth daily  for 1 days, then 5 tabs for 1 days, then 4 tabs for 1 days, then 3 tabs for 1 days, 2 tabs for 1 days, then 1 tab by mouth daily for 1 days, Disp: 21 tablet, Rfl: 0 ?  SOTALOL AF 160 MG TABS, Take 160 mg by mouth 2 (two) times daily., Disp: , Rfl:   ? ?Allergies  ?Allergen Reactions  ? Hydrocodone-Acetaminophen Nausea And Vomiting  ? ? ?Past Medical History:  ?Diagnosis Date  ? Cardiac defibrillator in place   ? Hypertension   ? Transposition great arteries   ?  ? ?Past Surgical History:  ?Procedure Laterality  Date  ? CARDIAC DEFIBRILLATOR PLACEMENT    ? CHOLECYSTECTOMY    ? ? ?Family History  ?Problem Relation Age of Onset  ? Healthy Mother   ? Healthy Father   ? ? ?Social History  ? ?Tobacco Use  ? Smoking status: Never  ? Smokeless tobacco: Former  ?Substance Use Topics  ? Alcohol use: No  ? Drug use: No  ? ? ?ROS ? ? ?Objective:  ? ?Vitals: ?BP 127/84 (BP Location: Right Arm)   Pulse 84   Temp 97.9 ?F (36.6 ?C) (Oral)   Resp 18   SpO2 95%  ? ?Physical Exam ?Constitutional:   ?   General: He is not in acute distress. ?   Appearance: Normal appearance. He is well-developed and normal weight. He is not ill-appearing, toxic-appearing or diaphoretic.  ?HENT:  ?   Head: Normocephalic and atraumatic.  ?   Right Ear: External ear normal.  ?   Left Ear: External ear normal.  ?   Nose: Nose normal.  ?   Mouth/Throat:  ?   Pharynx: Oropharynx is clear.  ?Eyes:  ?   General: No scleral icterus.    ?   Right eye: No discharge.     ?   Left eye: No  discharge.  ?   Extraocular Movements: Extraocular movements intact.  ?Cardiovascular:  ?   Rate and Rhythm: Normal rate.  ?Pulmonary:  ?   Effort: Pulmonary effort is normal.  ?Musculoskeletal:  ?   Cervical back: Normal range of motion.  ?Skin: ? ?    ?Neurological:  ?   Mental Status: He is alert and oriented to person, place, and time.  ?Psychiatric:     ?   Mood and Affect: Mood normal.     ?   Behavior: Behavior normal.     ?   Thought Content: Thought content normal.     ?   Judgment: Judgment normal.  ? ? ? ? ?Packing removed in its entirety.  New packing placed containing iodine.  Secured with paper tape, followed by nonadherent and Tegaderm. ? ?Assessment and Plan :  ? ?PDMP not reviewed this encounter. ? ?1. Encounter for wound care   ? ?Wound is well-appearing, no signs of infection.  Dressing replaced.  Follow-up with wound clinic.  Maintain current antibiotic use. Counseled patient on potential for adverse effects with medications prescribed/recommended today, ER and  return-to-clinic precautions discussed, patient verbalized understanding. ? ?  ?Wallis Bamberg, PA-C ?11/29/21 1223 ? ?

## 2021-11-29 NOTE — ED Triage Notes (Signed)
Had cyst removed from left shoulder.  States he needs dressing and packing removed with area repacked.  States he is supposed to go to wound care in [place].  States he is currently taking antibiotics, moxifloxacin 400 once daily .  States procedure was done at [facility]. ?

## 2021-12-09 ENCOUNTER — Other Ambulatory Visit: Payer: Self-pay

## 2021-12-09 ENCOUNTER — Ambulatory Visit (HOSPITAL_COMMUNITY): Payer: Medicare Other | Attending: Infectious Diseases | Admitting: Physical Therapy

## 2021-12-09 ENCOUNTER — Encounter (HOSPITAL_COMMUNITY): Payer: Self-pay | Admitting: Physical Therapy

## 2021-12-09 DIAGNOSIS — S21102S Unspecified open wound of left front wall of thorax without penetration into thoracic cavity, sequela: Secondary | ICD-10-CM | POA: Diagnosis present

## 2021-12-09 NOTE — Therapy (Signed)
[facility] ?[place] ?[address] ?[place], [place], [address] ?Phone: [phone]   Fax:  [phone] ? ?Wound Care Evaluation ? ?Patient Details  ?Name: Brett Marquez ?MRN: [id number] ?Date of Birth: Apr 28, 1981 ?Referring Provider (PT): Fuller Canada ? ? ?Encounter Date: 12/09/2021 ? ? PT End of Session - 12/09/21 1404   ? ? Number of Visits 8   ? Date for PT Re-Evaluation 01/08/22   ? Authorization Type medicare/caid   ? Progress Note Due on Visit 8   ? PT Start Time 1520   ? PT Stop Time [id number]   ? PT Time Calculation (min) 25 min   ? Activity Tolerance Patient tolerated treatment well   ? Behavior During Therapy [facility] for tasks assessed/performed   ? ?  ?  ? ?  ? ? ?Past Medical History:  ?Diagnosis Date  ? Cardiac defibrillator in place   ? Hypertension   ? Transposition great arteries   ? ? ?Past Surgical History:  ?Procedure Laterality Date  ? CARDIAC DEFIBRILLATOR PLACEMENT    ? CHOLECYSTECTOMY    ? ? ?There were no vitals filed for this visit. ? ? ? ? [facility] PT Assessment - 12/09/21 0001   ? ?  ? Assessment  ? Medical Diagnosis non healing surgical wound   ? Referring Provider (PT) Fuller Canada   ?  ? Precautions  ? Precautions None   ?  ? Restrictions  ? Weight Bearing Restrictions No   ?  ? Balance Screen  ? Has the patient fallen in the past 6 months No   ? Has the patient had a decrease in activity level because of a fear of falling?  No   ? Is the patient reluctant to leave their home because of a fear of falling?  No   ? ?  ?  ? ?  ? ? Wound Therapy - 12/09/21 0001   ? ? Subjective PT states that he had an infected cyst in close proximity to his pacemaker which was placed in the 1990's.  The MD removed the cyst and opted to keep the incision open hoping to get the pt to the wound center for dressing changes.  The wound center declined stating that they would just teach him how to take care ot the wound and have him or his family take care of the  wound at home.  They have been doing so but the wound has started to drain purulent drainage therefore he went to the MD who put him on antibiotic and referred him to this clinic.   ? Patient and Family Stated Goals wound to completely heal   ? Date of Onset 11/24/21   ? Prior Treatments self care, started antibiotics 3 days ago.   ? Pain Scale 0-10   ? Pain Score 0-No pain   ? Evaluation and Treatment Procedures Explained to Patient/Family Yes   ? Evaluation and Treatment Procedures agreed to   ? Wound Properties Date First Assessed: 12/09/21 Time First Assessed: 1320 Wound Type: Incision - Open Location: Chest Location Orientation: Left Present on Admission: Yes  ? Dressing Type Silver hydrofiber;Gauze (Comment)   ? Dressing Changed Changed   ? Dressing Status Old drainage   ? Dressing Change Frequency PRN   ? Site / Wound Assessment Granulation tissue;Red   ? % Wound base Red or Granulating 90%   ? % Wound base Yellow/Fibrinous Exudate 10%   ? Peri-wound Assessment Intact   ?  Wound Length (cm) 1.9 cm   ? Wound Width (cm) 0.7 cm   ? Wound Depth (cm) 2.5 cm   ? Wound Volume (cm^3) 3.33 cm^3   ? Wound Surface Area (cm^2) 1.33 cm^2   ? Undermining (cm) medially .2   ? Drainage Amount Moderate   ? Drainage Description Purulent;No odor;Serous   ? Treatment Cleansed;Debridement (Selective);Other (Comment)   ? Selective Debridement - Location wound bed   ? Selective Debridement - Tools Used Forceps   ? Selective Debridement - Tissue Removed biofilm   ? Wound Therapy - Clinical Statement Brett Marquez is a 41 yo male who had an infected surgical site from the removal of a cyst.  He has been on antibiotic for three days now and the drainage looks much better but still has some purulent drainage. Brett Marquez will benefit from skilled PT to create a healing enviornment and ensure that the wound does not revert to being infected.   ? Hydrotherapy Plan Debridement;Dressing change;Patient/family education   wound cleansing  ? Wound  Therapy - Frequency 2X / week   for 4 weeks  ? Wound Therapy - Current Recommendations PT   ? Wound Therapy - Follow Up Recommendations Other (comment)   OP  ? Wound Plan wound to be cleansed,dressing to be determined as needed.  Today therapist used calcium alginate as there continues to be active draining but I predict this will change and we can change to hydrogel or xeroform.  f/b dressing of 2x2 and medipore tape.   ? Dressing  calcium alginate with vaseline around perimeter, 2x2 and medipore tape to secure   ? ?  ?  ? ?  ? ? ? ? ? ? ? ? ?Objective measurements completed on examination: See above findings.  ? ? ? ? ? ? ? ? ? ? ? PT Education - 12/09/21 1403   ? ? Education Details Do not get wound wet if it gets wet change dressing and packing   ? Person(s) Educated Patient   ? Methods Explanation   ? Comprehension Verbalized understanding   ? ?  ?  ? ?  ? ? ? PT Short Term Goals - 12/09/21 1405   ? ?  ? PT SHORT TERM GOAL #1  ? Title wound to have scant drainage to demonstrate decreased infection   ? Time 2   ? Period Weeks   ? Status New   ? Target Date 12/23/21   ?  ? PT SHORT TERM GOAL #2  ? Title wound to have no further undermining of depth.   ? Time 2   ? Period Weeks   ? Status New   ? ?  ?  ? ?  ? ? ? ? PT Long Term Goals - 12/09/21 1406   ? ?  ? PT LONG TERM GOAL #1  ? Title Wound to be healed   ? Time 4   ? Period Weeks   ? Status New   ? Target Date 01/06/22   ? ?  ?  ? ?  ? ? ? ? ? ? ? Plan - 12/09/21 1407   ? ? Clinical Impression Statement see above   ? Personal Factors and Comorbidities Comorbidity 1   ? Comorbidities pacemaker   ? Examination-Activity Limitations Bathing;Dressing   ? Examination-Participation Restrictions Other   ? Stability/Clinical Decision Making Stable/Uncomplicated   ? Clinical Decision Making Low   ? Rehab Potential Good   ? PT Frequency 2x /  week   ? PT Duration 4 weeks   ? PT Treatment/Interventions Patient/family education;Manual techniques;Other (comment)    debridement  ? PT Next Visit Plan continue with wound care to include debridement as necessary and appropriate dressings.   ? ?  ?  ? ?  ? ? ?Patient will benefit from skilled therapeutic intervention in order to improve the following deficits and impairments:  Decreased skin integrity ? ?Visit Diagnosis: ?Open chest wound, left, sequela ? ? ? ?Problem List ?There are no problems to display for this patient. ? ?Rayetta Humphrey, PT CLT ?[phone]  ?12/09/2021, 2:10 PM ? ?[facility] ?[place] ?[address] ?[place], [place], [address] ?Phone: [phone]   Fax:  [phone] ? ?Name: Brett Marquez ?MRN: [id number] ?Date of Birth: [birth year] ? ? ?

## 2021-12-16 ENCOUNTER — Encounter (HOSPITAL_COMMUNITY): Payer: Self-pay

## 2021-12-16 ENCOUNTER — Ambulatory Visit (HOSPITAL_COMMUNITY): Payer: Medicare Other

## 2021-12-16 ENCOUNTER — Other Ambulatory Visit: Payer: Self-pay

## 2021-12-16 DIAGNOSIS — S21102S Unspecified open wound of left front wall of thorax without penetration into thoracic cavity, sequela: Secondary | ICD-10-CM

## 2021-12-16 NOTE — Therapy (Signed)
[facility] ?[place] ?[address] ?[place], [place], [address] ?Phone: [phone]   Fax:  [phone] ? ?Wound Care Therapy ? ?Patient Details  ?Name: Brett Marquez ?MRN: [id number] ?Date of Birth: [birth year] ?Referring Provider (PT): Fuller Canada ? ? ?Encounter Date: 12/16/2021 ? ? PT End of Session - 12/16/21 1602   ? ? Visit Number 2   ? Number of Visits 8   ? Date for PT Re-Evaluation 01/08/22   ? Authorization Type medicare/caid   ? Authorization - Visit Number 2   ? Authorization - Number of Visits 12   ? Progress Note Due on Visit 8   ? PT Start Time 1535   ? PT Stop Time [id number]   ? PT Time Calculation (min) 18 min   ? Activity Tolerance Patient tolerated treatment well   ? Behavior During Therapy [facility] for tasks assessed/performed   ? ?  ?  ? ?  ? ? ?Past Medical History:  ?Diagnosis Date  ? Cardiac defibrillator in place   ? Hypertension   ? Transposition great arteries   ? ? ?Past Surgical History:  ?Procedure Laterality Date  ? CARDIAC DEFIBRILLATOR PLACEMENT    ? CHOLECYSTECTOMY    ? ? ?There were no vitals filed for this visit. ? ? ? ? ? ? ? ? ? ? ? ? ? ? Wound Therapy - 12/16/21 0001   ? ? Subjective Pt stated tape fell off shortly after last apt, arrived with new tape on wound.   ? Patient and Family Stated Goals wound to completely heal   ? Date of Onset 11/24/21   ? Prior Treatments self care, started antibiotics 3 days ago.   ? Pain Scale 0-10   ? Pain Score 0-No pain   ? Evaluation and Treatment Procedures Explained to Patient/Family Yes   ? Evaluation and Treatment Procedures agreed to   ? Wound Properties Date First Assessed: 12/09/21 Time First Assessed: 1320 Wound Type: Incision - Open Location: Chest Location Orientation: Left Present on Admission: Yes  ? Wound Image Images linked: 1   ? Dressing Type Hydrogel   hydrogel, 2x2, medipore tape  ? Dressing Changed Changed   ? Dressing Status Old drainage   ? Dressing Change Frequency PRN   ? Site /  Wound Assessment Granulation tissue   ? % Wound base Red or Granulating 95%   ? % Wound base Yellow/Fibrinous Exudate 5%   ? Peri-wound Assessment Intact   ? Drainage Amount Minimal   ? Drainage Description No odor   ? Treatment Cleansed   ? Selective Debridement - Location wound bed   ? Selective Debridement - Tools Used Forceps   ? Selective Debridement - Tissue Removed biofilm   ? Wound Therapy - Clinical Statement Pt arrived with new dressings on wound, reports tape fell off.  Wound required no debridement, just cleaned well that removed biofilm.  Pt educated on proper dressings to allow wound to breath.  F/u next session, may be ready for self care.  Educated proper dressings prior DC next session.   ? Hydrotherapy Plan Debridement;Dressing change;Patient/family education   would cleansing  ? Wound Therapy - Frequency 2X / week   for 4 weeks  ? Wound Therapy - Current Recommendations PT   ? Wound Therapy - Follow Up Recommendations --   OP  ? Wound Plan wound to be cleansed,dressing to be determined as needed.  F/u on drainage, return to calcium alginate if increased  drainage.  If no debridement necessary educate on proper drainage and self care.   ? Dressing  vaseline perimeter, hydrogel on 2x2 wiht medipore tape   ? ?  ?  ? ?  ? ? ? ? ? ? ? ? ? ? ? ? PT Short Term Goals - 12/09/21 1405   ? ?  ? PT SHORT TERM GOAL #1  ? Title wound to have scant drainage to demonstrate decreased infection   ? Time 2   ? Period Weeks   ? Status New   ? Target Date 12/23/21   ?  ? PT SHORT TERM GOAL #2  ? Title wound to have no further undermining of depth.   ? Time 2   ? Period Weeks   ? Status New   ? ?  ?  ? ?  ? ? ? ? PT Long Term Goals - 12/09/21 1406   ? ?  ? PT LONG TERM GOAL #1  ? Title Wound to be healed   ? Time 4   ? Period Weeks   ? Status New   ? Target Date 01/06/22   ? ?  ?  ? ?  ? ? ? ? ? ? ? ? ? ?Patient will benefit from skilled therapeutic intervention in order to improve the following deficits and  impairments:    ? ?Visit Diagnosis: ?Open chest wound, left, sequela ? ? ? ? ?Problem List ?There are no problems to display for this patient. ? ?Brett Marquez, LPTA/CLT; CBIS ?[phone] ? ?Brett Marquez, PTA ?12/16/2021, 4:03 PM ? ?[place] ?[place] ?[address] ?[place], [place], [address] ?Phone: [phone]   Fax:  [phone] ? ?Name: Brett Marquez ?MRN: [id number] ?Date of Birth: [birth year] ? ? ? ? ?

## 2021-12-18 ENCOUNTER — Ambulatory Visit (HOSPITAL_COMMUNITY): Payer: Medicare Other

## 2021-12-18 ENCOUNTER — Other Ambulatory Visit: Payer: Self-pay

## 2021-12-18 ENCOUNTER — Encounter (HOSPITAL_COMMUNITY): Payer: Self-pay

## 2021-12-18 DIAGNOSIS — S21102S Unspecified open wound of left front wall of thorax without penetration into thoracic cavity, sequela: Secondary | ICD-10-CM | POA: Diagnosis not present

## 2021-12-18 NOTE — Therapy (Addendum)
[facility] ?[place] ?[address] ?[place], [place], [address] ?Phone: [phone]   Fax:  [phone] ? ?Wound Care Therapy ? ?Patient Details  ?Name: Brett Marquez ?MRN: [id number] ?Date of Birth: 01-Jun-1981 ?Referring Provider (PT): Fuller Canada ?PHYSICAL THERAPY DISCHARGE SUMMARY ? ?Visits from Start of Care: 3 ? ?Current functional level related to goals / functional outcomes: ?Pt able to complete self care ?  ?Remaining deficits: ?Open wound  ?  ?Education / Equipment: ?Self care   ? ?Patient agrees to discharge. Patient goals were partially met. Patient is being discharged due to being pleased with the current functional level.  ? ?Encounter Date: 12/18/2021 ? ? PT End of Session - 12/18/21 1330   ? ? Visit Number 3   ? Number of Visits 8   ? Date for PT Re-Evaluation 01/08/22   ? Authorization Type medicare/caid   ? Authorization - Visit Number 3   ? Authorization - Number of Visits 12   ? Progress Note Due on Visit 8   ? PT Start Time 1310   ? PT Stop Time 1318   ? PT Time Calculation (min) 8 min   ? Activity Tolerance Patient tolerated treatment well   ? Behavior During Therapy [facility] for tasks assessed/performed   ? ?  ?  ? ?  ? ? ?Past Medical History:  ?Diagnosis Date  ? Cardiac defibrillator in place   ? Hypertension   ? Transposition great arteries   ? ? ?Past Surgical History:  ?Procedure Laterality Date  ? CARDIAC DEFIBRILLATOR PLACEMENT    ? CHOLECYSTECTOMY    ? ? ?There were no vitals filed for this visit. ? ? ? Subjective Assessment - 12/18/21 1326   ? ? Subjective Pt stated tape able to stay on, no reports of pain.   ? ?  ?  ? ?  ? ? ? ? ? ? ? ? ? ? ? ? Wound Therapy - 12/18/21 0001   ? ? Subjective Pt stated tape able to stay on, no reports of pain.   ? Patient and Family Stated Goals wound to completely heal   ? Date of Onset 11/24/21   ? Prior Treatments self care, started antibiotics 3 days ago.   ? Pain Scale 0-10   ? Pain Score 0-No pain   ?  Evaluation and Treatment Procedures Explained to Patient/Family Yes   ? Evaluation and Treatment Procedures agreed to   ? Wound Properties Date First Assessed: 12/09/21 Time First Assessed: 1320 Wound Type: Incision - Open Location: Chest Location Orientation: Left Present on Admission: Yes  ? Wound Image Images linked: 1   ? Dressing Type Hydrogel   hydrogel, 2x2, medipore tape  ? Dressing Changed Changed   ? Dressing Status Old drainage   ? Dressing Change Frequency PRN   ? Site / Wound Assessment Granulation tissue   ? % Wound base Red or Granulating 100%   ? % Wound base Yellow/Fibrinous Exudate 0%   ? Peri-wound Assessment Intact   ? Wound Length (cm) 1.1 cm   was 1.9  ? Wound Width (cm) 0.4 cm   was .7  ? Wound Depth (cm) 0.1 cm   ? Wound Volume (cm^3) 0.04 cm^3   ? Wound Surface Area (cm^2) 0.44 cm^2   ? Drainage Amount None   ? Drainage Description No odor   ? Treatment Cleansed   ? Wound Therapy - Clinical Statement Removed dressings with no debridement necessary, cleansed  wound well.  Measurements taken with reduction in size and no undermining present.  Educated proper care and dressings to use.  Discussed bathing, instructed pt he could shower and dry promtly, not to soak open wound in water with verbalized understanding.   ? Hydrotherapy Plan Debridement;Dressing change;Patient/family education   wound cleansing  ? Wound Therapy - Frequency 2X / week   4 weeks  ? Wound Therapy - Current Recommendations PT   ? Wound Therapy - Follow Up Recommendations Other (comment)   OP  ? Wound Plan DC to self care   ? Dressing  vaseline perimeter, hydrogel on 2x2 wiht medipore tape   ? ?  ?  ? ?  ? ? ? ? ? ? ? ? ? ? ? ? PT Short Term Goals - 12/18/21 1332   ? ?  ? PT SHORT TERM GOAL #1  ? Title wound to have scant drainage to demonstrate decreased infection   ? Status Achieved   ?  ? PT SHORT TERM GOAL #2  ? Title wound to have no further undermining of depth.   ? Status Achieved   ? ?  ?  ? ?  ? ? ? ? PT Long Term  Goals - 12/09/21 1406   ? ?  ? PT LONG TERM GOAL #1  ? Title Wound to be healed   ? Time 4   ? Period Weeks   ? Status On going   ? Target Date 01/06/22   ? ?  ?  ? ?  ? ? ? ? ? ? ? ? ? ?Patient will benefit from skilled therapeutic intervention in order to improve the following deficits and impairments:    ? ?Visit Diagnosis: ?Open chest wound, left, sequela ? ? ? ? ?Problem List ?There are no problems to display for this patient. ? ?Ihor Austin, LPTA/CLT; CBIS ?[phone] ? ?Rayetta Humphrey, PT CLT ?[phone]  ?12/18/2021, 1:32 PM ? ?[place] ?[place] ?[address] ?[place], [place], [address] ?Phone: [phone]   Fax:  [phone] ? ?Name: Brett Marquez ?MRN: [id number] ?Date of Birth: 12/05/1980 ? ? ? ? ?

## 2021-12-22 ENCOUNTER — Ambulatory Visit (HOSPITAL_COMMUNITY): Payer: Medicare Other | Admitting: Physical Therapy

## 2021-12-24 ENCOUNTER — Ambulatory Visit (HOSPITAL_COMMUNITY): Payer: Medicare Other | Admitting: Physical Therapy

## 2021-12-29 ENCOUNTER — Ambulatory Visit (HOSPITAL_COMMUNITY): Payer: Medicare Other

## 2021-12-31 ENCOUNTER — Ambulatory Visit (HOSPITAL_COMMUNITY): Payer: Medicare Other | Admitting: Physical Therapy

## 2022-01-05 ENCOUNTER — Ambulatory Visit (HOSPITAL_COMMUNITY): Payer: Medicare Other | Admitting: Physical Therapy

## 2022-01-07 ENCOUNTER — Ambulatory Visit (HOSPITAL_COMMUNITY): Payer: Medicare Other | Admitting: Physical Therapy

## 2022-03-19 ENCOUNTER — Ambulatory Visit (INDEPENDENT_AMBULATORY_CARE_PROVIDER_SITE_OTHER): Payer: Medicare Other | Admitting: Family Medicine

## 2022-03-19 VITALS — BP 164/98 | HR 72 | Temp 97.2°F | Ht 73.0 in | Wt 292.0 lb

## 2022-03-19 DIAGNOSIS — Z130 Encounter for screening for diseases of the blood and blood-forming organs and certain disorders involving the immune mechanism: Secondary | ICD-10-CM

## 2022-03-19 DIAGNOSIS — Z862 Personal history of diseases of the blood and blood-forming organs and certain disorders involving the immune mechanism: Secondary | ICD-10-CM

## 2022-03-19 DIAGNOSIS — I441 Atrioventricular block, second degree: Secondary | ICD-10-CM | POA: Diagnosis not present

## 2022-03-19 DIAGNOSIS — Z95810 Presence of automatic (implantable) cardiac defibrillator: Secondary | ICD-10-CM

## 2022-03-19 DIAGNOSIS — Z13220 Encounter for screening for lipoid disorders: Secondary | ICD-10-CM

## 2022-03-19 DIAGNOSIS — Q203 Discordant ventriculoarterial connection: Secondary | ICD-10-CM | POA: Diagnosis not present

## 2022-03-19 DIAGNOSIS — R739 Hyperglycemia, unspecified: Secondary | ICD-10-CM

## 2022-03-19 DIAGNOSIS — I10 Essential (primary) hypertension: Secondary | ICD-10-CM

## 2022-03-19 DIAGNOSIS — I509 Heart failure, unspecified: Secondary | ICD-10-CM

## 2022-03-19 MED ORDER — SACUBITRIL-VALSARTAN 97-103 MG PO TABS: 1.0000 | ORAL_TABLET | Freq: Two times a day (BID) | ORAL | 1 refills | Status: DC

## 2022-03-19 MED ORDER — TRIAMCINOLONE ACETONIDE 0.5 % EX OINT: 1.0000 | TOPICAL_OINTMENT | Freq: Two times a day (BID) | CUTANEOUS | 1 refills | Status: DC

## 2022-03-20 LAB — CMP14+EGFR
ALT: 46 IU/L — ABNORMAL HIGH (ref 0–44)
AST: 42 IU/L — ABNORMAL HIGH (ref 0–40)
Albumin/Globulin Ratio: 1.5 (ref 1.2–2.2)
Albumin: 4.4 g/dL (ref 4.0–5.0)
Alkaline Phosphatase: 73 IU/L (ref 44–121)
BUN/Creatinine Ratio: 9 (ref 9–20)
BUN: 8 mg/dL (ref 6–24)
Bilirubin Total: 0.5 mg/dL (ref 0.0–1.2)
CO2: 24 mmol/L (ref 20–29)
Calcium: 9.6 mg/dL (ref 8.7–10.2)
Chloride: 104 mmol/L (ref 96–106)
Creatinine, Ser: 0.89 mg/dL (ref 0.76–1.27)
Globulin, Total: 3 g/dL (ref 1.5–4.5)
Glucose: 117 mg/dL — ABNORMAL HIGH (ref 70–99)
Potassium: 4.5 mmol/L (ref 3.5–5.2)
Sodium: 144 mmol/L (ref 134–144)
Total Protein: 7.4 g/dL (ref 6.0–8.5)
eGFR: 110 mL/min/{1.73_m2} (ref >59)

## 2022-03-20 LAB — CBC
Hematocrit: 40.5 % (ref 37.5–51.0)
Hemoglobin: 13.8 g/dL (ref 13.0–17.7)
MCH: 29.7 pg (ref 26.6–33.0)
MCHC: 34.1 g/dL (ref 31.5–35.7)
MCV: 87 fL (ref 79–97)
Platelets: 119 10*3/uL — ABNORMAL LOW (ref 150–450)
RBC: 4.65 x10E6/uL (ref 4.14–5.80)
RDW: 14 % (ref 11.6–15.4)
WBC: 3.1 10*3/uL — ABNORMAL LOW (ref 3.4–10.8)

## 2022-03-20 LAB — HEMOGLOBIN A1C
Est. average glucose Bld gHb Est-mCnc: 128 mg/dL
Hgb A1c MFr Bld: 6.1 % — ABNORMAL HIGH (ref 4.8–5.6)

## 2022-03-20 LAB — LIPID PANEL
Chol/HDL Ratio: 4.8 ratio (ref 0.0–5.0)
Cholesterol, Total: 95 mg/dL — ABNORMAL LOW (ref 100–199)
HDL: 20 mg/dL — ABNORMAL LOW (ref >39)
LDL Chol Calc (NIH): 45 mg/dL (ref 0–99)
Triglycerides: 177 mg/dL — ABNORMAL HIGH (ref 0–149)
VLDL Cholesterol Cal: 30 mg/dL (ref 5–40)

## 2022-03-22 DIAGNOSIS — I10 Essential (primary) hypertension: Secondary | ICD-10-CM | POA: Insufficient documentation

## 2022-04-02 ENCOUNTER — Ambulatory Visit (INDEPENDENT_AMBULATORY_CARE_PROVIDER_SITE_OTHER): Payer: Medicare Other | Admitting: Family Medicine

## 2022-04-02 VITALS — BP 145/83 | HR 75 | Temp 97.9°F | Ht 73.0 in | Wt 288.6 lb

## 2022-04-02 DIAGNOSIS — R21 Rash and other nonspecific skin eruption: Secondary | ICD-10-CM | POA: Diagnosis not present

## 2022-04-02 DIAGNOSIS — I10 Essential (primary) hypertension: Secondary | ICD-10-CM | POA: Diagnosis not present

## 2022-04-02 DIAGNOSIS — I509 Heart failure, unspecified: Secondary | ICD-10-CM | POA: Diagnosis not present

## 2022-04-02 NOTE — Assessment & Plan Note (Signed)
Referring to Dermatology.

## 2022-04-02 NOTE — Patient Instructions (Signed)
Lab today.  Once it returns I plan to start the Spironolactone. Once started you will need repeat lab again in ~ 10 days.  Follow up in 1 month.

## 2022-04-02 NOTE — Assessment & Plan Note (Signed)
After lab returns we will start spironolactone.  Needs close follow-up with cardiology.

## 2022-04-02 NOTE — Progress Notes (Signed)
Subjective:  Patient ID: Brett Marquez, male    DOB: [birth year]  Age: 41 y.o. MRN: [id number]  CC: Chief Complaint  Patient presents with   Hypertension    Patient also had labs done at last visit     HPI:  41 year old male with hypertension, transposition of the great arteries, CHF presents for follow-up.  Blood pressure has improved.  He is on max dose Entresto.  Remains on carvedilol, sotalol and Lasix.  He is feeling well.  BP remains elevated however.  Needs additional medication regarding his blood pressure and to aid heart failure.  No reports of shortness of breath or chest pain.  He states that he continues to have difficulty with rash on his lower extremities.  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    Social Hx   Social History   Socioeconomic History   Marital status: Married    Spouse name: Not on file   Number of children: Not on file   Years of education: Not on file   Highest education level: Not on file  Occupational History   Not on file  Tobacco Use   Smoking status: Never   Smokeless tobacco: Former  Substance and Sexual Activity   Alcohol use: No   Drug use: No   Sexual activity: Not on file  Other Topics Concern   Not on file  Social History Narrative   Not on file   Social Determinants of Health   Financial Resource Strain: Not on file  Food Insecurity: Not on file  Transportation Needs: Not on file  Physical Activity: Not on file  Stress: Not on file  Social Connections: Not on file    Review of Systems Per HPI  Objective:  BP (!) 145/83   Pulse 75   Temp 97.9 F (36.6 C) (Oral)   Ht 6\' 1"  (1.854 m)   Wt 288 lb  9.6 oz (130.9 kg)   SpO2 96%   BMI 38.08 kg/m      04/02/2022    8:11 AM 03/19/2022   11:02 AM 03/19/2022   10:37 AM  BP/Weight  Systolic BP 145 164 158  Diastolic BP 83 98 98  Wt. (Lbs) 288.6  292  BMI 38.08 kg/m2  38.52 kg/m2    Physical Exam Vitals reviewed.  Constitutional:      General: He is not in acute distress.    Appearance: Normal appearance. He is obese.  HENT:     Head: Normocephalic and atraumatic.  Cardiovascular:     Rate and Rhythm: Normal rate and regular rhythm.     Comments: No significant lower extremity edema. Pulmonary:     Effort: Pulmonary effort is normal.     Breath sounds: Normal breath sounds. No wheezing or rales.  Neurological:     Mental Status: He is alert.  Psychiatric:        Mood and Affect: Mood normal.        Behavior: Behavior normal.     Lab Results  Component Value Date   WBC 3.1 (L) 03/19/2022   HGB 13.8 03/19/2022   HCT 40.5 03/19/2022   PLT 119 (L) 03/19/2022   GLUCOSE 117 (H) 03/19/2022   CHOL  95 (L) 03/19/2022   TRIG 177 (H) 03/19/2022   HDL 20 (L) 03/19/2022   LDLCALC 45 03/19/2022   ALT 46 (H) 03/19/2022   AST 42 (H) 03/19/2022   NA 144 03/19/2022   K 4.5 03/19/2022   CL 104 03/19/2022   CREATININE 0.89 03/19/2022   BUN 8 03/19/2022   CO2 24 03/19/2022   HGBA1C 6.1 (H) 03/19/2022     Assessment & Plan:   Problem List Items Addressed This Visit       Cardiovascular and Mediastinum   Hypertension - Primary    BP improved.   BMP today.  Once his lab returns we will start spironolactone.      Relevant Orders   Basic Metabolic Panel   Congestive heart failure (HCC)    After lab returns we will start spironolactone.  Needs close follow-up with cardiology.        Musculoskeletal and Integument   Rash    Referring to Dermatology.      Relevant Orders   Ambulatory referral to Dermatology   Follow-up:  Return in about 1 month (around 05/03/2022).  Everlene Other DO [facility]

## 2022-04-02 NOTE — Assessment & Plan Note (Signed)
BP improved.   BMP today.  Once his lab returns we will start spironolactone.

## 2022-04-03 LAB — BASIC METABOLIC PANEL
BUN/Creatinine Ratio: 9 (ref 9–20)
BUN: 9 mg/dL (ref 6–24)
CO2: 23 mmol/L (ref 20–29)
Calcium: 9.3 mg/dL (ref 8.7–10.2)
Chloride: 105 mmol/L (ref 96–106)
Creatinine, Ser: 1.02 mg/dL (ref 0.76–1.27)
Glucose: 143 mg/dL — ABNORMAL HIGH (ref 70–99)
Potassium: 4 mmol/L (ref 3.5–5.2)
Sodium: 143 mmol/L (ref 134–144)
eGFR: 94 mL/min/{1.73_m2} (ref >59)

## 2022-04-06 ENCOUNTER — Other Ambulatory Visit: Payer: Self-pay | Admitting: Family Medicine

## 2022-04-06 MED ORDER — SPIRONOLACTONE 25 MG PO TABS: 25.0000 mg | ORAL_TABLET | Freq: Every day | ORAL | 3 refills | Status: DC

## 2022-05-03 ENCOUNTER — Ambulatory Visit (INDEPENDENT_AMBULATORY_CARE_PROVIDER_SITE_OTHER): Payer: Medicare Other | Admitting: Family Medicine

## 2022-05-03 ENCOUNTER — Encounter: Payer: Self-pay | Admitting: Family Medicine

## 2022-05-03 VITALS — BP 129/88 | HR 71 | Temp 97.9°F | Wt 283.8 lb

## 2022-05-03 DIAGNOSIS — I10 Essential (primary) hypertension: Secondary | ICD-10-CM

## 2022-05-03 DIAGNOSIS — I509 Heart failure, unspecified: Secondary | ICD-10-CM

## 2022-05-03 NOTE — Progress Notes (Signed)
Subjective:  Patient ID: Brett Marquez, male    DOB: 04/20/1981  Age: 41 y.o. MRN: [id number]  CC: Chief Complaint  Patient presents with   Hypertension    Pt arrives to follow up on HTN. Pt states he has a few headaches since starting med. Pt states he does not eat during the day.     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  Blood pressure improved.  His Sherryll Burger has been increased and spironolactone has been added after discussion with cardiology.  He is compliant with carvedilol as well.  He does not use Lasix much at all.  He also remains on sotalol.  Has follow-up with cardiology later this month.  He states that he is not having any chest pain or shortness of breath.  He has noted recent lightheadedness particularly when he bends down and gets up too quickly.  This has started since the addition of spironolactone.  Needs metabolic panel to recheck potassium.  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    Social Hx   Social History   Socioeconomic History   Marital status: Married    Spouse name: Not on file   Number of children: Not on file   Years of education: Not on file   Highest education level: Not on file  Occupational History   Not on file  Tobacco Use   Smoking status: Never   Smokeless tobacco: Former  Substance and Sexual Activity   Alcohol use: No   Drug use: No   Sexual activity: Not on file  Other Topics Concern   Not on file  Social History Narrative   Not on  file   Social Determinants of Health   Financial Resource Strain: Not on file  Food Insecurity: Not on file  Transportation Needs: Not on file  Physical Activity: Not on file  Stress: Not on file  Social Connections: Not on file    Review of Systems Per HPI  Objective:  BP 129/88   Pulse 71   Temp 97.9 F (36.6 C)   Wt 283 lb 12.8 oz (128.7 kg)   SpO2 97%   BMI 37.44 kg/m      05/03/2022    9:19 AM 04/02/2022    8:11 AM 03/19/2022   11:02 AM  BP/Weight  Systolic BP 129 145 164  Diastolic BP 88 83 98  Wt. (Lbs) 283.8 288.6   BMI 37.44 kg/m2 38.08 kg/m2     Physical Exam Vitals and nursing note reviewed.  Constitutional:      General: He is not in acute distress.    Appearance: Normal appearance. He is obese.  HENT:     Head: Normocephalic and atraumatic.  Eyes:     General:        Right eye: No discharge.        Left eye: No discharge.     Conjunctiva/sclera: Conjunctivae normal.  Cardiovascular:     Rate and Rhythm: Normal rate and regular rhythm.  Pulmonary:     Effort: Pulmonary effort is normal.  Breath sounds: Normal breath sounds. No wheezing, rhonchi or rales.  Neurological:     Mental Status: He is alert.  Psychiatric:        Mood and Affect: Mood normal.        Behavior: Behavior normal.     Lab Results  Component Value Date   WBC 3.1 (L) 03/19/2022   HGB 13.8 03/19/2022   HCT 40.5 03/19/2022   PLT 119 (L) 03/19/2022   GLUCOSE 143 (H) 04/02/2022   CHOL 95 (L) 03/19/2022   TRIG 177 (H) 03/19/2022   HDL 20 (L) 03/19/2022   LDLCALC 45 03/19/2022   ALT 46 (H) 03/19/2022   AST 42 (H) 03/19/2022   NA 143 04/02/2022   K 4.0 04/02/2022   CL 105 04/02/2022   CREATININE 1.02 04/02/2022   BUN 9 04/02/2022   CO2 23 04/02/2022   HGBA1C 6.1 (H) 03/19/2022     Assessment & Plan:   Problem List Items Addressed This Visit       Cardiovascular and Mediastinum   Congestive heart failure (HCC)    Euvolemic.  Has upcoming follow-up with  cardiology.  Continue current medications.      Hypertension - Primary    Improved.  Continue current medications.  Rechecking BMP today to reevaluate potassium given recent addition of spironolactone.      Relevant Orders   Basic Metabolic Panel    Follow-up: 3 to 6 months  Aleyna Cueva Adriana Simas DO [facility]

## 2022-05-03 NOTE — Assessment & Plan Note (Signed)
Improved.  Continue current medications.  Rechecking BMP today to reevaluate potassium given recent addition of spironolactone.

## 2022-05-03 NOTE — Patient Instructions (Signed)
Lab today.  Follow up in 3-6 months.  Take care  Dr. Adriana Simas

## 2022-05-03 NOTE — Assessment & Plan Note (Signed)
Euvolemic.  Has upcoming follow-up with cardiology.  Continue current medications.

## 2022-05-04 LAB — BASIC METABOLIC PANEL
BUN/Creatinine Ratio: 11 (ref 9–20)
BUN: 12 mg/dL (ref 6–24)
CO2: 21 mmol/L (ref 20–29)
Calcium: 9.1 mg/dL (ref 8.7–10.2)
Chloride: 101 mmol/L (ref 96–106)
Creatinine, Ser: 1.07 mg/dL (ref 0.76–1.27)
Glucose: 130 mg/dL — ABNORMAL HIGH (ref 70–99)
Potassium: 4.3 mmol/L (ref 3.5–5.2)
Sodium: 140 mmol/L (ref 134–144)
eGFR: 89 mL/min/{1.73_m2} (ref >59)

## 2022-08-03 ENCOUNTER — Ambulatory Visit (INDEPENDENT_AMBULATORY_CARE_PROVIDER_SITE_OTHER): Payer: Medicare Other | Admitting: Family Medicine

## 2022-08-03 VITALS — BP 136/87 | Ht 73.0 in | Wt 288.8 lb

## 2022-08-03 DIAGNOSIS — I509 Heart failure, unspecified: Secondary | ICD-10-CM | POA: Diagnosis not present

## 2022-08-03 DIAGNOSIS — R7303 Prediabetes: Secondary | ICD-10-CM | POA: Diagnosis not present

## 2022-08-03 DIAGNOSIS — R21 Rash and other nonspecific skin eruption: Secondary | ICD-10-CM | POA: Diagnosis not present

## 2022-08-03 DIAGNOSIS — I10 Essential (primary) hypertension: Secondary | ICD-10-CM

## 2022-08-03 DIAGNOSIS — Q203 Discordant ventriculoarterial connection: Secondary | ICD-10-CM | POA: Diagnosis not present

## 2022-08-03 MED ORDER — CLOBETASOL PROPIONATE 0.05 % EX OINT: 1.0000 | TOPICAL_OINTMENT | Freq: Two times a day (BID) | CUTANEOUS | 0 refills | Status: DC

## 2022-08-03 NOTE — Assessment & Plan Note (Signed)
Patient continues to be seen at [facility].  Offered referral to [facility] given the distance that is for him to travel to [place].  He will consider. Labs today.

## 2022-08-03 NOTE — Assessment & Plan Note (Signed)
Stable. Euvolemic today. Continue current medications. 

## 2022-08-03 NOTE — Assessment & Plan Note (Signed)
Treating with clobetasol.  Referring to dermatology in [place].

## 2022-08-03 NOTE — Assessment & Plan Note (Signed)
BP fairly well controlled.  Continue current medications.

## 2022-08-03 NOTE — Progress Notes (Signed)
Subjective:  Patient ID: Brett Marquez, male    DOB: 11/01/1980  Age: 41 y.o. 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  Blood pressure is stable on carvedilol, sotalol, Entresto, spironolactone.  Needs labs today as recommended by his cardiologist at [facility].  He follows with the adult congenital heart disease clinic.  He needs echo.  Last echo was in April.  No chest pain.  Shortness of breath is minimal at this time.  He is able to exert himself significantly without having to stop and rest.  Patient also continues to have rash on the anterior shins bilaterally.  Was referred to dermatology but referral was not complete as the provider has now retired.  Area is quite itchy.  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    Social Hx   Social History   Socioeconomic History   Marital status: Married    Spouse name: Not on file   Number of children: Not on file   Years of education: Not on file   Highest education level: Not on file  Occupational History   Not on file  Tobacco Use   Smoking status: Never   Smokeless tobacco: Former  Substance and Sexual Activity   Alcohol use: No   Drug use: No   Sexual activity: Not on file  Other Topics Concern   Not on file  Social History Narrative   Not on file   Social Determinants of Health   Financial Resource Strain: Not on file   Food Insecurity: Not on file  Transportation Needs: Not on file  Physical Activity: Not on file  Stress: Not on file  Social Connections: Not on file    Review of Systems Per HPI  Objective:  BP 136/87   Ht 6' 1" (1.854 m)   Wt 288 lb 12.8 oz (131 kg)   BMI 38.10 kg/m      08/03/2022    9:37 AM 05/03/2022    9:19 AM 04/02/2022    8:11 AM  BP/Weight  Systolic BP 244 628 638  Diastolic BP 87 88 83  Wt. (Lbs) 288.8 283.8 288.6  BMI 38.1 kg/m2 37.44 kg/m2 38.08 kg/m2    Physical Exam Constitutional:      Appearance: Normal appearance. He is obese.  HENT:     Head: Normocephalic and atraumatic.  Cardiovascular:     Rate and Rhythm: Normal rate and regular rhythm.  Pulmonary:     Effort: Pulmonary effort is normal.     Breath sounds: Normal breath sounds. No wheezing, rhonchi or rales.  Skin:    Comments: Erythematous and slightly dry rash on the anterior shins bilaterally.  Neurological:     Mental Status: He is alert.  Psychiatric:        Mood and Affect: Mood normal.        Behavior: Behavior normal.     Lab Results  Component Value Date   WBC  3.1 (L) 03/19/2022   HGB 13.8 03/19/2022   HCT 40.5 03/19/2022   PLT 119 (L) 03/19/2022   GLUCOSE 130 (H) 05/03/2022   CHOL 95 (L) 03/19/2022   TRIG 177 (H) 03/19/2022   HDL 20 (L) 03/19/2022   LDLCALC 45 03/19/2022   ALT 46 (H) 03/19/2022   AST 42 (H) 03/19/2022   NA 140 05/03/2022   K 4.3 05/03/2022   CL 101 05/03/2022   CREATININE 1.07 05/03/2022   BUN 12 05/03/2022   CO2 21 05/03/2022   HGBA1C 6.1 (H) 03/19/2022     Assessment & Plan:   Problem List Items Addressed This Visit       Cardiovascular and Mediastinum   Congestive heart failure ([place]) - Primary    Stable.  Euvolemic today.  Continue current medications.      Relevant Orders   ECHOCARDIOGRAM COMPLETE   CBC   CMP14+EGFR   Magnesium   D-TGA (dextro-transposition of great arteries)    Patient continues to be seen at [facility].  Offered  referral to [facility] given the distance that is for him to travel to [place].  He will consider. Labs today.      Relevant Orders   ECHOCARDIOGRAM COMPLETE   CMP14+EGFR   Magnesium   Hypertension    BP fairly well controlled.  Continue current medications.        Musculoskeletal and Integument   Rash    Treating with clobetasol.  Referring to dermatology in [place].      Relevant Orders   Ambulatory referral to Dermatology     Other   Prediabetes   Relevant Orders   Hemoglobin A1c    Meds ordered this encounter  Medications   clobetasol ointment (TEMOVATE) 0.05 %    Sig: Apply 1 Application topically 2 (two) times daily.    Dispense:  60 g    Refill:  0    Follow-up:  6 months  [place] DO [place]

## 2022-08-03 NOTE — Patient Instructions (Signed)
I have ordered an Echo.  Another referral placed to dermatology.  Medications as prescribed.  Follow up in 6 months.  Consider seeing [place] with your wife.

## 2022-08-04 LAB — CMP14+EGFR
ALT: 51 IU/L — ABNORMAL HIGH (ref 0–44)
AST: 39 IU/L (ref 0–40)
Albumin/Globulin Ratio: 1.6 (ref 1.2–2.2)
Albumin: 4.4 g/dL (ref 4.1–5.1)
Alkaline Phosphatase: 72 IU/L (ref 44–121)
BUN/Creatinine Ratio: 10 (ref 9–20)
BUN: 9 mg/dL (ref 6–24)
Bilirubin Total: 0.6 mg/dL (ref 0.0–1.2)
CO2: 26 mmol/L (ref 20–29)
Calcium: 9.3 mg/dL (ref 8.7–10.2)
Chloride: 103 mmol/L (ref 96–106)
Creatinine, Ser: 0.91 mg/dL (ref 0.76–1.27)
Globulin, Total: 2.8 g/dL (ref 1.5–4.5)
Glucose: 121 mg/dL — ABNORMAL HIGH (ref 70–99)
Potassium: 4.3 mmol/L (ref 3.5–5.2)
Sodium: 142 mmol/L (ref 134–144)
Total Protein: 7.2 g/dL (ref 6.0–8.5)
eGFR: 107 mL/min/{1.73_m2} (ref >59)

## 2022-08-04 LAB — CBC
Hematocrit: 39.8 % (ref 37.5–51.0)
Hemoglobin: 13.2 g/dL (ref 13.0–17.7)
MCH: 29 pg (ref 26.6–33.0)
MCHC: 33.2 g/dL (ref 31.5–35.7)
MCV: 88 fL (ref 79–97)
Platelets: 113 10*3/uL — ABNORMAL LOW (ref 150–450)
RBC: 4.55 x10E6/uL (ref 4.14–5.80)
RDW: 14.2 % (ref 11.6–15.4)
WBC: 3.5 10*3/uL (ref 3.4–10.8)

## 2022-08-04 LAB — HEMOGLOBIN A1C
Est. average glucose Bld gHb Est-mCnc: 134 mg/dL
Hgb A1c MFr Bld: 6.3 % — ABNORMAL HIGH (ref 4.8–5.6)

## 2022-08-04 LAB — MAGNESIUM: Magnesium: 1.6 mg/dL (ref 1.6–2.3)

## 2022-09-23 ENCOUNTER — Other Ambulatory Visit: Payer: Self-pay | Admitting: Family Medicine

## 2022-10-20 ENCOUNTER — Telehealth: Payer: Self-pay | Admitting: Family Medicine

## 2022-10-20 NOTE — Telephone Encounter (Signed)
  Left message for patient to call back and schedule Medicare Annual Wellness Visit (AWV) in office.   If unable to come into the office for AWV,  please offer to do virtually or by telephone.  No hx of AWV eligible for AWVI per [employer] as of  09/27/2009   Please schedule at anytime with [facility] Advisor.      45 minute appointment   Any questions, please call me.  Thank you,   Stephanie  Ambulatory Clinical Support for [facility] ??[phone] or ??[phone]

## 2022-10-26 ENCOUNTER — Ambulatory Visit (INDEPENDENT_AMBULATORY_CARE_PROVIDER_SITE_OTHER): Payer: Medicare Other | Admitting: Family Medicine

## 2022-10-26 DIAGNOSIS — I509 Heart failure, unspecified: Secondary | ICD-10-CM | POA: Diagnosis not present

## 2022-10-26 DIAGNOSIS — I10 Essential (primary) hypertension: Secondary | ICD-10-CM

## 2022-10-26 MED ORDER — EPLERENONE 25 MG PO TABS: 25.0000 mg | ORAL_TABLET | Freq: Every day | ORAL | 3 refills | Status: DC

## 2022-10-26 NOTE — Assessment & Plan Note (Signed)
Experiencing adverse effect from spironolactone.  Stopping.  Starting eplerenone.  Metabolic panel in 7 to 10 days.  Has cardiology follow-up at [facility].

## 2022-10-26 NOTE — Patient Instructions (Addendum)
Stop spironolactone.  Start eplerenone.  Labs in 7 days.  Follow up in 3-6 months or sooner if needed.

## 2022-10-26 NOTE — Progress Notes (Signed)
Subjective:  Patient ID: Brett Marquez, male    DOB: 28-Dec-1980  Age: 42 y.o. MRN: [id number]  CC: Chief Complaint  Patient presents with   side effects from new medication    Patient states since he started the spironolactone his breast and nipple area is sore bilaterally like he has been working out- specialist said it was from med.    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  Patient states that he is feeling well and has had a significant improvement in his blood pressure with the addition of spironolactone.  However, he is having some breast discomfort and gynecomastia.  He would like to discuss this today.  He is no longer having to use Lasix.  He remains on carvedilol, Entresto, sotalol.    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    Social Hx   Social History   Socioeconomic History   Marital status: Married    Spouse name: Not on file   Number of children: Not on file   Years of education: Not on file   Highest education level: Not on file  Occupational History   Not on file  Tobacco Use   Smoking status: Never   Smokeless tobacco: Former  Substance and Sexual Activity   Alcohol use: No   Drug use: No   Sexual activity: Not on file  Other Topics Concern   Not on file  Social History Narrative   Not on file   Social Determinants of Health   Financial Resource Strain: Not on file  Food Insecurity: Not on file  Transportation Needs: Not on file  Physical  Activity: Not on file  Stress: Not on file  Social Connections: Not on file    Review of Systems Per HPI  Objective:  BP 122/84   Ht 6\' 1"  (1.854 m)   Wt 284 lb 12.8 oz (129.2 kg)   BMI 37.57 kg/m      10/26/2022   10:07 AM 08/03/2022    9:37 AM 05/03/2022    9:19 AM  BP/Weight  Systolic BP 536 644 034  Diastolic BP 84 87 88  Wt. (Lbs) 284.8 288.8 283.8  BMI 37.57 kg/m2 38.1 kg/m2 37.44 kg/m2    Physical Exam Vitals and nursing note reviewed.  Constitutional:      Appearance: Normal appearance. He is obese.  HENT:     Head: Normocephalic and atraumatic.  Cardiovascular:     Rate and Rhythm: Normal rate and regular rhythm.     Comments: No significant lower extremity edema. Pulmonary:     Effort: Pulmonary effort is normal.     Breath sounds: Normal breath sounds.  Chest:     Comments: Gynecomastia noted. Neurological:     Mental Status: He is alert.  Psychiatric:        Mood and Affect: Mood normal.        Behavior: Behavior normal.     Lab Results  Component Value Date   WBC 3.5 08/03/2022   HGB 13.2 08/03/2022   HCT 39.8 08/03/2022  PLT 113 (L) 08/03/2022   GLUCOSE 121 (H) 08/03/2022   CHOL 95 (L) 03/19/2022   TRIG 177 (H) 03/19/2022   HDL 20 (L) 03/19/2022   LDLCALC 45 03/19/2022   ALT 51 (H) 08/03/2022   AST 39 08/03/2022   NA 142 08/03/2022   K 4.3 08/03/2022   CL 103 08/03/2022   CREATININE 0.91 08/03/2022   BUN 9 08/03/2022   CO2 26 08/03/2022   HGBA1C 6.3 (H) 08/03/2022     Assessment & Plan:   Problem List Items Addressed This Visit       Cardiovascular and Mediastinum   Congestive heart failure ([place])    Experiencing adverse effect from spironolactone.  Stopping.  Starting eplerenone.  Metabolic panel in 7 to 10 days.  Has cardiology follow-up at [facility].      Relevant Medications   eplerenone (INSPRA) 25 MG tablet   Hypertension   Relevant Medications   eplerenone (INSPRA) 25 MG tablet   Other Relevant Orders   Basic  Metabolic Panel    Meds ordered this encounter  Medications   eplerenone (INSPRA) 25 MG tablet    Sig: Take 1 tablet (25 mg total) by mouth daily.    Dispense:  90 tablet    Refill:  3    Follow-up: 3 to 6 months  [place]

## 2022-11-12 ENCOUNTER — Ambulatory Visit (INDEPENDENT_AMBULATORY_CARE_PROVIDER_SITE_OTHER): Payer: Medicare Other

## 2022-11-12 VITALS — Ht 73.0 in | Wt 284.0 lb

## 2022-11-12 DIAGNOSIS — Z0000 Encounter for general adult medical examination without abnormal findings: Secondary | ICD-10-CM | POA: Diagnosis not present

## 2022-11-12 NOTE — Progress Notes (Signed)
Subjective:   Brett Marquez is a 42 y.o. male who presents for an Initial Medicare Annual Wellness Visit.  I connected with  Brett Marquez on 11/12/22 by a audio enabled telemedicine application and verified that I am speaking with the correct person using two identifiers.  Patient Location: Home  Provider Location: Office/Clinic  I discussed the limitations of evaluation and management by telemedicine. The patient expressed understanding and agreed to proceed.  Review of Systems     Cardiac Risk Factors include: male gender;sedentary lifestyle;hypertension     Objective:    Today's Vitals   11/12/22 1454  Weight: 284 lb (128.8 kg)  Height: 6' 1"$  (1.854 m)   Body mass index is 37.47 kg/m.     11/12/2022    2:58 PM 12/09/2021    1:09 PM 06/27/2017    3:22 PM 09/09/2014    7:57 AM  Advanced Directives  Does Patient Have a Medical Advance Directive? No No No No  Does patient want to make changes to medical advance directive? Yes (MAU/Ambulatory/Procedural Areas - Information given)     Would patient like information on creating a medical advance directive?  No - Patient declined  No - patient declined information    Current Medications (verified) Outpatient Encounter Medications as of 11/12/2022  Medication Sig   carvedilol (COREG) 25 MG tablet Take 25 mg by mouth 2 (two) times daily.   ENTRESTO 97-103 MG TAKE (1) TABLET BY MOUTH TWICE DAILY.   eplerenone (INSPRA) 25 MG tablet Take 1 tablet (25 mg total) by mouth daily.   omeprazole (PRILOSEC) 20 MG capsule Take 20 mg by mouth daily.   sotalol (BETAPACE) 160 MG tablet Take 160 mg by mouth 2 (two) times daily.   No facility-administered encounter medications on file as of 11/12/2022.    Allergies (verified) Hydrocodone-acetaminophen   History: Past Medical History:  Diagnosis Date   Cardiac defibrillator in place    Hypertension    Transposition great arteries    Past Surgical History:  Procedure Laterality  Date   CARDIAC DEFIBRILLATOR PLACEMENT     CHOLECYSTECTOMY     Family History  Problem Relation Age of Onset   Healthy Mother    Healthy Father    Social History   Socioeconomic History   Marital status: Married    Spouse name: Not on file   Number of children: Not on file   Years of education: Not on file   Highest education level: Not on file  Occupational History   Not on file  Tobacco Use   Smoking status: Never   Smokeless tobacco: Former  Substance and Sexual Activity   Alcohol use: No   Drug use: No   Sexual activity: Not on file  Other Topics Concern   Not on file  Social History Narrative   Not on file   Social Determinants of Health   Financial Resource Strain: Low Risk  (11/12/2022)   Overall Financial Resource Strain (CARDIA)    Difficulty of Paying Living Expenses: Not hard at all  Food Insecurity: No Food Insecurity (11/12/2022)   Hunger Vital Sign    Worried About Running Out of Food in the Last Year: Never true    [place] in the Last Year: Never true  Transportation Needs: No Transportation Needs (11/12/2022)   PRAPARE - Hydrologist (Medical): No    Lack of Transportation (Non-Medical): No  Physical Activity: Inactive (11/12/2022)   Exercise  Vital Sign    Days of Exercise per Week: 0 days    Minutes of Exercise per Session: 0 min  Stress: No Stress Concern Present (11/12/2022)   [place]    Feeling of Stress : Not at all  Social Connections: Moderately Integrated (11/12/2022)   Social Connection and Isolation Panel [NHANES]    Frequency of Communication with Friends and Family: More than three times a week    Frequency of Social Gatherings with Friends and Family: Three times a week    Attends Religious Services: 1 to 4 times per year    Active Member of Clubs or Organizations: No    Attends Archivist Meetings: Never    Marital Status:  Married    Tobacco Counseling Counseling given: Not Answered   Clinical Intake:  Pre-visit preparation completed: Yes  Pain : No/denies pain  Diabetes: No  How often do you need to have someone help you when you read instructions, pamphlets, or other written materials from your doctor or pharmacy?: 1 - Never  Diabetic?No   Interpreter Needed?: No  Information entered by :: Denman George LPN   Activities of Daily Living    11/12/2022    2:58 PM  In your present state of health, do you have any difficulty performing the following activities:  Hearing? 0  Vision? 0  Difficulty concentrating or making decisions? 0  Walking or climbing stairs? 0  Dressing or bathing? 0  Doing errands, shopping? 0  Preparing Food and eating ? N  Using the Toilet? N  In the past six months, have you accidently leaked urine? N  Do you have problems with loss of bowel control? N  Managing your Medications? N  Managing your Finances? N  Housekeeping or managing your Housekeeping? N    Patient Care Team: [name], DO as PCP - General (Family Medicine)  Indicate any recent Medical Services you may have received from other than [facility] providers in the past year (date may be approximate).     Assessment:   This is a routine wellness examination for Cavanaugh.  Hearing/Vision screen Hearing Screening - Comments:: Denies hearing difficulties  Vision Screening - Comments:: No vision problems. up to date with routine eye exams with Dr. Jorja Loa    Dietary issues and exercise activities discussed: Current Exercise Habits: The patient does not participate in regular exercise at present   Goals Addressed             This Visit's Progress    Increase physical activity         Depression Screen    11/12/2022    2:56 PM 10/26/2022   10:08 AM 03/19/2022   10:42 AM  PHQ 2/9 Scores  PHQ - 2 Score 1 1 0  PHQ- 9 Score 2 2     Fall Risk    11/12/2022    2:55 PM 03/19/2022   10:42 AM   Fall Risk   Falls in the past year? 0 0  Number falls in past yr: 0 0  Injury with Fall? 0 0  Risk for fall due to :  No Fall Risks  Follow up Falls prevention discussed;Education provided;Falls evaluation completed Falls evaluation completed    FALL RISK PREVENTION PERTAINING TO THE HOME:  Any stairs in or around the home? No  If so, are there any without handrails? No  Home free of loose throw rugs in walkways, pet beds, electrical  cords, etc? Yes  Adequate lighting in your home to reduce risk of falls? Yes   ASSISTIVE DEVICES UTILIZED TO PREVENT FALLS:  Life alert? No  Use of a cane, walker or w/c? No  Grab bars in the bathroom? No  Shower chair or bench in shower? No  Elevated toilet seat or a handicapped toilet? No   TIMED UP AND GO:  Was the test performed? No . Telephonic visit   Cognitive Function:        11/12/2022    2:58 PM  6CIT Screen  What Year? 0 points  What month? 0 points  What time? 0 points  Count back from 20 0 points  Months in reverse 0 points  Repeat phrase 0 points  Total Score 0 points    Immunizations Immunization History  Administered Date(s) Administered   Influenza,inj,Quad PF,6+ Mos 07/05/2017, 12/08/2018    TDAP status: Due, Education has been provided regarding the importance of this vaccine. Advised may receive this vaccine at local pharmacy or Health Dept. Aware to provide a copy of the vaccination record if obtained from local pharmacy or Health Dept. Verbalized acceptance and understanding.  Flu Vaccine status: Declined, Education has been provided regarding the importance of this vaccine but patient still declined. Advised may receive this vaccine at local pharmacy or Health Dept. Aware to provide a copy of the vaccination record if obtained from local pharmacy or Health Dept. Verbalized acceptance and understanding.  Pneumococcal vaccine status: Up to date  Covid-19 vaccine status: Information provided on how to obtain  vaccines.   Qualifies for Shingles Vaccine? No    Screening Tests Health Maintenance  Topic Date Due   HIV Screening  Never done   Hepatitis C Screening  Never done   DTaP/Tdap/Td (1 - Tdap) Never done   INFLUENZA VACCINE  12/26/2022 (Originally 04/27/2022)   Medicare Annual Wellness (AWV)  11/13/2023   HPV VACCINES  Aged Out   COVID-19 Vaccine  Discontinued    Health Maintenance  Health Maintenance Due  Topic Date Due   HIV Screening  Never done   Hepatitis C Screening  Never done   DTaP/Tdap/Td (1 - Tdap) Never done    Lung Cancer Screening: (Low Dose CT Chest recommended if Age 78-80 years, 30 pack-year currently smoking OR have quit w/in 15years.) does not qualify.   Lung Cancer Screening Referral: n/a  Additional Screening:  Hepatitis C Screening: does qualify; Completed at next office visit   Vision Screening: Recommended annual ophthalmology exams for early detection of glaucoma and other disorders of the eye. Is the patient up to date with their annual eye exam?  Yes  Who is the provider or what is the name of the office in which the patient attends annual eye exams? Dr. Jorja Loa If pt is not established with a provider, would they like to be referred to a provider to establish care? No .   Dental Screening: Recommended annual dental exams for proper oral hygiene  Community Resource Referral / Chronic Care Management: CRR required this visit?  No   CCM required this visit?  No      Plan:     I have personally reviewed and noted the following in the patient's chart:   Medical and social history Use of alcohol, tobacco or illicit drugs  Current medications and supplements including opioid prescriptions. Patient is not currently taking opioid prescriptions. Functional ability and status Nutritional status Physical activity Advanced directives List of other physicians Hospitalizations, surgeries, and  ER visits in previous 12 months Vitals Screenings to  include cognitive, depression, and falls Referrals and appointments  In addition, I have reviewed and discussed with patient certain preventive protocols, quality metrics, and best practice recommendations. A written personalized care plan for preventive services as well as general preventive health recommendations were provided to patient.     Vanetta Mulders, [place]   [id number]   Due to this being a virtual visit, the after visit summary with patients personalized plan was offered to patient via mail or my-chart. per request, patient was mailed a copy of AVS.  Nurse Notes: No concerns

## 2022-11-12 NOTE — Patient Instructions (Signed)
Brett Marquez , Thank you for taking time to come for your Medicare Wellness Visit. I appreciate your ongoing commitment to your health goals. Please review the following plan we discussed and let me know if I can assist you in the future.   These are the goals we discussed:  Goals      Increase physical activity        This is a list of the screening recommended for you and due dates:  Health Maintenance  Topic Date Due   HIV Screening  Never done   Hepatitis C Screening: USPSTF Recommendation to screen - Ages 7-79 yo.  Never done   DTaP/Tdap/Td vaccine (1 - Tdap) Never done   Flu Shot  12/26/2022*   Medicare Annual Wellness Visit  11/13/2023   HPV Vaccine  Aged Out   COVID-19 Vaccine  Discontinued  *Topic was postponed. The date shown is not the original due date.    Advanced directives: Advance directive discussed with you today. I have provided a copy for you to complete at home and have notarized. Once this is complete please bring a copy in to our office so we can scan it into your chart.   Conditions/risks identified: Aim for 30 minutes of exercise or brisk walking, 6-8 glasses of water, and 5 servings of fruits and vegetables each day.   Next appointment: Follow up in one year for your annual wellness visit   Preventive Care 40-64 Years, Male Preventive care refers to lifestyle choices and visits with your health care provider that can promote health and wellness. What does preventive care include? A yearly physical exam. This is also called an annual well check. Dental exams once or twice a year. Routine eye exams. Ask your health care provider how often you should have your eyes checked. Personal lifestyle choices, including: Daily care of your teeth and gums. Regular physical activity. Eating a healthy diet. Avoiding tobacco and drug use. Limiting alcohol use. Practicing safe sex. Taking low-dose aspirin every day starting at age 83. What happens during an annual  well check? The services and screenings done by your health care provider during your annual well check will depend on your age, overall health, lifestyle risk factors, and family history of disease. Counseling  Your health care provider may ask you questions about your: Alcohol use. Tobacco use. Drug use. Emotional well-being. Home and relationship well-being. Sexual activity. Eating habits. Work and work Statistician. Screening  You may have the following tests or measurements: Height, weight, and BMI. Blood pressure. Lipid and cholesterol levels. These may be checked every 5 years, or more frequently if you are over 30 years old. Skin check. Lung cancer screening. You may have this screening every year starting at age 50 if you have a 30-pack-year history of smoking and currently smoke or have quit within the past 15 years. Fecal occult blood test (FOBT) of the stool. You may have this test every year starting at age 6. Flexible sigmoidoscopy or colonoscopy. You may have a sigmoidoscopy every 5 years or a colonoscopy every 10 years starting at age 6. Prostate cancer screening. Recommendations will vary depending on your family history and other risks. Hepatitis C blood test. Hepatitis B blood test. Sexually transmitted disease (STD) testing. Diabetes screening. This is done by checking your blood sugar (glucose) after you have not eaten for a while (fasting). You may have this done every 1-3 years. Discuss your test results, treatment options, and if necessary, the need for more  tests with your health care provider. Vaccines  Your health care provider may recommend certain vaccines, such as: Influenza vaccine. This is recommended every year. Tetanus, diphtheria, and acellular pertussis (Tdap, Td) vaccine. You may need a Td booster every 10 years. Zoster vaccine. You may need this after age 53. Pneumococcal 13-valent conjugate (PCV13) vaccine. You may need this if you have certain  conditions and have not been vaccinated. Pneumococcal polysaccharide (PPSV23) vaccine. You may need one or two doses if you smoke cigarettes or if you have certain conditions. Talk to your health care provider about which screenings and vaccines you need and how often you need them. This information is not intended to replace advice given to you by your health care provider. Make sure you discuss any questions you have with your health care provider. Document Released: 10/10/2015 Document Revised: 06/02/2016 Document Reviewed: 07/15/2015 Elsevier Interactive Patient Education  2017 [place] Prevention in the Home Falls can cause injuries. They can happen to people of all ages. There are many things you can do to make your home safe and to help prevent falls. What can I do on the outside of my home? Regularly fix the edges of walkways and driveways and fix any cracks. Remove anything that might make you trip as you walk through a door, such as a raised step or threshold. Trim any bushes or trees on the path to your home. Use bright outdoor lighting. Clear any walking paths of anything that might make someone trip, such as rocks or tools. Regularly check to see if handrails are loose or broken. Make sure that both sides of any steps have handrails. Any raised decks and porches should have guardrails on the edges. Have any leaves, snow, or ice cleared regularly. Use sand or salt on walking paths during winter. Clean up any spills in your garage right away. This includes oil or grease spills. What can I do in the bathroom? Use night lights. Install grab bars by the toilet and in the tub and shower. Do not use towel bars as grab bars. Use non-skid mats or decals in the tub or shower. If you need to sit down in the shower, use a plastic, non-slip stool. Keep the floor dry. Clean up any water that spills on the floor as soon as it happens. Remove soap buildup in the tub or shower  regularly. Attach bath mats securely with double-sided non-slip rug tape. Do not have throw rugs and other things on the floor that can make you trip. What can I do in the bedroom? Use night lights. Make sure that you have a light by your bed that is easy to reach. Do not use any sheets or blankets that are too big for your bed. They should not hang down onto the floor. Have a firm chair that has side arms. You can use this for support while you get dressed. Do not have throw rugs and other things on the floor that can make you trip. What can I do in the kitchen? Clean up any spills right away. Avoid walking on wet floors. Keep items that you use a lot in easy-to-reach places. If you need to reach something above you, use a strong step stool that has a grab bar. Keep electrical cords out of the way. Do not use floor polish or wax that makes floors slippery. If you must use wax, use non-skid floor wax. Do not have throw rugs and other things on the floor  that can make you trip. What can I do with my stairs? Do not leave any items on the stairs. Make sure that there are handrails on both sides of the stairs and use them. Fix handrails that are broken or loose. Make sure that handrails are as long as the stairways. Check any carpeting to make sure that it is firmly attached to the stairs. Fix any carpet that is loose or worn. Avoid having throw rugs at the top or bottom of the stairs. If you do have throw rugs, attach them to the floor with carpet tape. Make sure that you have a light switch at the top of the stairs and the bottom of the stairs. If you do not have them, ask someone to add them for you. What else can I do to help prevent falls? Wear shoes that: Do not have high heels. Have rubber bottoms. Are comfortable and fit you well. Are closed at the toe. Do not wear sandals. If you use a stepladder: Make sure that it is fully opened. Do not climb a closed stepladder. Make sure that  both sides of the stepladder are locked into place. Ask someone to hold it for you, if possible. Clearly mark and make sure that you can see: Any grab bars or handrails. First and last steps. Where the edge of each step is. Use tools that help you move around (mobility aids) if they are needed. These include: Canes. Walkers. Scooters. Crutches. Turn on the lights when you go into a dark area. Replace any light bulbs as soon as they burn out. Set up your furniture so you have a clear path. Avoid moving your furniture around. If any of your floors are uneven, fix them. If there are any pets around you, be aware of where they are. Review your medicines with your doctor. Some medicines can make you feel dizzy. This can increase your chance of falling. Ask your doctor what other things that you can do to help prevent falls. This information is not intended to replace advice given to you by your health care provider. Make sure you discuss any questions you have with your health care provider. Document Released: 07/10/2009 Document Revised: 02/19/2016 Document Reviewed: 10/18/2014 Elsevier Interactive Patient Education  2017 [employer].

## 2022-11-13 LAB — BASIC METABOLIC PANEL
BUN/Creatinine Ratio: 14 (ref 9–20)
BUN: 15 mg/dL (ref 6–24)
CO2: 18 mmol/L — ABNORMAL LOW (ref 20–29)
Calcium: 9.5 mg/dL (ref 8.7–10.2)
Chloride: 107 mmol/L — ABNORMAL HIGH (ref 96–106)
Creatinine, Ser: 1.11 mg/dL (ref 0.76–1.27)
Glucose: 129 mg/dL — ABNORMAL HIGH (ref 70–99)
Potassium: 4.5 mmol/L (ref 3.5–5.2)
Sodium: 144 mmol/L (ref 134–144)
eGFR: 84 mL/min/{1.73_m2} (ref >59)

## 2022-12-20 ENCOUNTER — Other Ambulatory Visit: Payer: Self-pay | Admitting: Family Medicine

## 2022-12-25 ENCOUNTER — Encounter (HOSPITAL_COMMUNITY): Payer: Self-pay

## 2022-12-25 ENCOUNTER — Emergency Department (HOSPITAL_COMMUNITY)
Admission: EM | Admit: 2022-12-25 | Discharge: 2022-12-25 | Disposition: A | Discharge status: Home / Self Care | Payer: Medicare Other | Source: Home / Self Care | Attending: Emergency Medicine | Admitting: Emergency Medicine

## 2022-12-25 ENCOUNTER — Other Ambulatory Visit: Payer: Self-pay

## 2022-12-25 ENCOUNTER — Emergency Department (HOSPITAL_COMMUNITY): Payer: Medicare Other

## 2022-12-25 DIAGNOSIS — Z1152 Encounter for screening for COVID-19: Secondary | ICD-10-CM | POA: Insufficient documentation

## 2022-12-25 DIAGNOSIS — R509 Fever, unspecified: Secondary | ICD-10-CM

## 2022-12-25 DIAGNOSIS — J690 Pneumonitis due to inhalation of food and vomit: Secondary | ICD-10-CM | POA: Diagnosis not present

## 2022-12-25 DIAGNOSIS — J189 Pneumonia, unspecified organism: Secondary | ICD-10-CM | POA: Diagnosis not present

## 2022-12-25 HISTORY — DX: Heart failure, unspecified: I50.9

## 2022-12-25 LAB — SARS CORONAVIRUS 2 BY RT PCR: SARS Coronavirus 2 by RT PCR: NEGATIVE

## 2022-12-25 LAB — LACTIC ACID, PLASMA: Lactic Acid, Venous: 1.5 mmol/L (ref 0.5–1.9)

## 2022-12-25 LAB — CBC WITH DIFFERENTIAL/PLATELET
Abs Immature Granulocytes: 0.01 10*3/uL (ref 0.00–0.07)
Basophils Absolute: 0 10*3/uL (ref 0.0–0.1)
Basophils Relative: 1 %
Eosinophils Absolute: 0.1 10*3/uL (ref 0.0–0.5)
Eosinophils Relative: 2 %
HCT: 34.8 % — ABNORMAL LOW (ref 39.0–52.0)
Hemoglobin: 11.8 g/dL — ABNORMAL LOW (ref 13.0–17.0)
Immature Granulocytes: 0 %
Lymphocytes Relative: 22 %
Lymphs Abs: 0.9 10*3/uL (ref 0.7–4.0)
MCH: 28.9 pg (ref 26.0–34.0)
MCHC: 33.9 g/dL (ref 30.0–36.0)
MCV: 85.3 fL (ref 80.0–100.0)
Monocytes Absolute: 0.3 10*3/uL (ref 0.1–1.0)
Monocytes Relative: 7 %
Neutro Abs: 2.7 10*3/uL (ref 1.7–7.7)
Neutrophils Relative %: 68 %
Platelets: 98 10*3/uL — ABNORMAL LOW (ref 150–400)
RBC: 4.08 MIL/uL — ABNORMAL LOW (ref 4.22–5.81)
RDW: 13.8 % (ref 11.5–15.5)
WBC: 4 10*3/uL (ref 4.0–10.5)
nRBC: 0 % (ref 0.0–0.2)

## 2022-12-25 LAB — COMPREHENSIVE METABOLIC PANEL
ALT: 41 U/L (ref 0–44)
AST: 39 U/L (ref 15–41)
Albumin: 3.6 g/dL (ref 3.5–5.0)
Alkaline Phosphatase: 52 U/L (ref 38–126)
Anion gap: 8 (ref 5–15)
BUN: 12 mg/dL (ref 6–20)
CO2: 24 mmol/L (ref 22–32)
Calcium: 8.2 mg/dL — ABNORMAL LOW (ref 8.9–10.3)
Chloride: 104 mmol/L (ref 98–111)
Creatinine, Ser: 0.94 mg/dL (ref 0.61–1.24)
GFR, Estimated: >60 mL/min (ref >60)
Glucose, Bld: 156 mg/dL — ABNORMAL HIGH (ref 70–99)
Potassium: 3.2 mmol/L — ABNORMAL LOW (ref 3.5–5.1)
Sodium: 136 mmol/L (ref 135–145)
Total Bilirubin: 1 mg/dL (ref 0.3–1.2)
Total Protein: 6.7 g/dL (ref 6.5–8.1)

## 2022-12-25 LAB — APTT: aPTT: 26 seconds (ref 24–36)

## 2022-12-25 LAB — PROTIME-INR
INR: 1.1 (ref 0.8–1.2)
Prothrombin Time: 13.8 seconds (ref 11.4–15.2)

## 2022-12-25 NOTE — ED Notes (Signed)
Post surgical dressing removed by Dr Betsey Holiday. This RN placed new dressing (telfa and tegaderm). Brett Corona Edd Fabian

## 2022-12-25 NOTE — ED Notes (Signed)
Pt aware of need for urine sample. Urinal placed at bedside. Brett Marquez

## 2022-12-25 NOTE — ED Notes (Signed)
ED Provider at bedside. 

## 2022-12-25 NOTE — ED Provider Notes (Signed)
[facility] Provider Note   CSN: [id number] Arrival date & time: 12/25/22  0120     History  Chief Complaint  Patient presents with   Fever    Brett Marquez is a 42 y.o. male.  Patient presents to the emergency department for evaluation of fever.  Patient developed a low-grade fever at home tonight.  He was concerned because he had his defibrillator changed earlier today at [facility].  He contacted the on-call cardiologist and was told to come to the ER for evaluation.       Home Medications Prior to Admission medications   Medication Sig Start Date End Date Taking? Authorizing Provider  carvedilol (COREG) 25 MG tablet Take 25 mg by mouth 2 (two) times daily. 02/23/22  Yes [provider]  ENTRESTO 97-103 MG TAKE (1) TABLET BY MOUTH TWICE DAILY. 12/22/22  Yes Cook, Jayce G, DO  eplerenone (INSPRA) 25 MG tablet Take 1 tablet (25 mg total) by mouth daily. 10/26/22  Yes Cook, Jayce G, DO  omeprazole (PRILOSEC) 20 MG capsule Take 20 mg by mouth daily. 07/31/14  Yes [provider]  sotalol (BETAPACE) 160 MG tablet Take 160 mg by mouth 2 (two) times daily. 02/10/22  Yes [provider]      Allergies    Hydrocodone-acetaminophen    Review of Systems   Review of Systems  Physical Exam Updated Vital Signs BP 130/69   Pulse 70   Temp 97.7 F (36.5 C) (Oral)   Resp 18   Ht 6\' 1"  (1.854 m)   Wt 129 kg   SpO2 94%   BMI 37.52 kg/m  Physical Exam Vitals and nursing note reviewed.  Constitutional:      General: He is not in acute distress.    Appearance: He is well-developed.  HENT:     Head: Normocephalic and atraumatic.     Mouth/Throat:     Mouth: Mucous membranes are moist.  Eyes:     General: Vision grossly intact. Gaze aligned appropriately.     Extraocular Movements: Extraocular movements intact.     Conjunctiva/sclera: Conjunctivae normal.  Cardiovascular:     Rate and Rhythm: Normal rate and  regular rhythm.     Pulses: Normal pulses.     Heart sounds: Normal heart sounds, S1 normal and S2 normal. No murmur heard.    No friction rub. No gallop.  Pulmonary:     Effort: Pulmonary effort is normal. No respiratory distress.     Breath sounds: Normal breath sounds.  Abdominal:     Palpations: Abdomen is soft.     Tenderness: There is no abdominal tenderness. There is no guarding or rebound.     Hernia: No hernia is present.  Musculoskeletal:        General: No swelling.     Cervical back: Full passive range of motion without pain, normal range of motion and neck supple. No pain with movement, spinous process tenderness or muscular tenderness. Normal range of motion.     Right lower leg: No edema.     Left lower leg: No edema.  Skin:    General: Skin is warm and dry.     Capillary Refill: Capillary refill takes less than 2 seconds.     Findings: No ecchymosis, erythema, lesion or wound.     Comments: Defibrillator pocket appears normal.  No overlying erythema, induration.  No drainage or bleeding.  Neurological:     Mental Status: He  is alert and oriented to person, place, and time.     GCS: GCS eye subscore is 4. GCS verbal subscore is 5. GCS motor subscore is 6.     Cranial Nerves: Cranial nerves 2-12 are intact.     Sensory: Sensation is intact.     Motor: Motor function is intact. No weakness or abnormal muscle tone.     Coordination: Coordination is intact.  Psychiatric:        Mood and Affect: Mood normal.        Speech: Speech normal.        Behavior: Behavior normal.     ED Results / Procedures / Treatments   Labs (all labs ordered are listed, but only abnormal results are displayed) Labs Reviewed  COMPREHENSIVE METABOLIC PANEL - Abnormal; Notable for the following components:      Result Value   Potassium 3.2 (*)    Glucose, Bld 156 (*)    Calcium 8.2 (*)    All other components within normal limits  CBC WITH DIFFERENTIAL/PLATELET - Abnormal; Notable for  the following components:   RBC 4.08 (*)    Hemoglobin 11.8 (*)    HCT 34.8 (*)    Platelets 98 (*)    All other components within normal limits  CULTURE, BLOOD (ROUTINE X 2)  CULTURE, BLOOD (ROUTINE X 2)  SARS CORONAVIRUS 2 BY RT PCR  LACTIC ACID, PLASMA  PROTIME-INR  APTT  URINALYSIS, W/ REFLEX TO CULTURE (INFECTION SUSPECTED)    EKG EKG Interpretation  Date/Time:  Saturday December 25 2022 01:38:57 EDT Ventricular Rate:  70 PR Interval:  189 QRS Duration: 178 QT Interval:  501 QTC Calculation: 541 R Axis:   -87 Text Interpretation: AV sequential or dual chamber electronic pacemaker Confirmed by Orpah Greek [phone]) on 12/25/2022 2:07:06 AM  Radiology DG Chest Port 1 View  Result Date: 12/25/2022 CLINICAL DATA:  Possible sepsis EXAM: PORTABLE CHEST 1 VIEW COMPARISON:  06/27/2017 FINDINGS: Defibrillator is again seen. Cardiac shadow is enlarged. The lungs are well aerated bilaterally. No focal infiltrate or effusion is seen. No bony abnormality is noted. IMPRESSION: No active disease. Electronically Signed   By: Inez Catalina M.D.   On: 12/25/2022 02:27    Procedures Procedures    Medications Ordered in ED Medications - No data to display  ED Course/ Medical Decision Making/ A&P                             Medical Decision Making Amount and/or Complexity of Data Reviewed Labs: ordered. Radiology: ordered. ECG/medicine tests: ordered.   Patient presents to the emergency department for evaluation of fever.  Differential diagnosis considered includes, but not limited to: Viral illness; bacterial illness including pneumonia, UTI, cellulitis; sepsis  Patient appears well at arrival.  He is afebrile but did take Tylenol at home.  All vital signs are normal.  He did have a complete workup.  Visualization of the surgical site is normal, no signs of soft tissue infection associated with the procedure.  Chest x-ray is clear.  Patient does not have a leukocytosis.   Lactic acid is normal.  All workup reassuring, no signs of infection secondary to pacemaker placement.  Blood culture pending.  He will be appropriate for discharge, no further treatment necessary at this time.  Return for worsening symptoms.        Final Clinical Impression(s) / ED Diagnoses Final diagnoses:  Fever, unspecified fever cause  Rx / DC Orders ED Discharge Orders     None         Marshell Rieger, Gwenyth Allegra, MD 12/25/22 [phone]

## 2022-12-25 NOTE — ED Notes (Signed)
[name] at bedside. [name]

## 2022-12-25 NOTE — ED Triage Notes (Signed)
Pt arrived via POV c/o low grade fever, chills at home following battery replacement procedure of his ICD earlier yesterday. Pt reports Temp of 101F, called [facility] and was recommended to seek evaluation at nearest ER. Pt reports taking 1000mg  Tylenol at 2330 PTA.

## 2022-12-26 ENCOUNTER — Emergency Department (HOSPITAL_COMMUNITY): Payer: Medicare Other

## 2022-12-26 ENCOUNTER — Encounter (HOSPITAL_COMMUNITY): Payer: Self-pay | Admitting: *Deleted

## 2022-12-26 ENCOUNTER — Inpatient Hospital Stay (HOSPITAL_COMMUNITY)
Admission: EM | Admit: 2022-12-26 | Discharge: 2022-12-28 | DRG: 177 | Disposition: A | Discharge status: Home / Self Care | Payer: Medicare Other | Attending: Family Medicine | Admitting: Family Medicine

## 2022-12-26 ENCOUNTER — Other Ambulatory Visit: Payer: Self-pay

## 2022-12-26 DIAGNOSIS — I110 Hypertensive heart disease with heart failure: Secondary | ICD-10-CM | POA: Diagnosis present

## 2022-12-26 DIAGNOSIS — Z1152 Encounter for screening for COVID-19: Secondary | ICD-10-CM

## 2022-12-26 DIAGNOSIS — J189 Pneumonia, unspecified organism: Secondary | ICD-10-CM | POA: Diagnosis not present

## 2022-12-26 DIAGNOSIS — J690 Pneumonitis due to inhalation of food and vomit: Secondary | ICD-10-CM

## 2022-12-26 DIAGNOSIS — I509 Heart failure, unspecified: Secondary | ICD-10-CM

## 2022-12-26 DIAGNOSIS — K920 Hematemesis: Secondary | ICD-10-CM | POA: Diagnosis present

## 2022-12-26 DIAGNOSIS — R042 Hemoptysis: Principal | ICD-10-CM | POA: Diagnosis present

## 2022-12-26 DIAGNOSIS — Z95810 Presence of automatic (implantable) cardiac defibrillator: Secondary | ICD-10-CM | POA: Diagnosis not present

## 2022-12-26 DIAGNOSIS — Q203 Discordant ventriculoarterial connection: Secondary | ICD-10-CM

## 2022-12-26 DIAGNOSIS — I4892 Unspecified atrial flutter: Secondary | ICD-10-CM | POA: Diagnosis present

## 2022-12-26 DIAGNOSIS — Z886 Allergy status to analgesic agent status: Secondary | ICD-10-CM

## 2022-12-26 DIAGNOSIS — Z6838 Body mass index (BMI) 38.0-38.9, adult: Secondary | ICD-10-CM

## 2022-12-26 DIAGNOSIS — E669 Obesity, unspecified: Secondary | ICD-10-CM | POA: Diagnosis present

## 2022-12-26 DIAGNOSIS — Z87891 Personal history of nicotine dependence: Secondary | ICD-10-CM

## 2022-12-26 DIAGNOSIS — I5022 Chronic systolic (congestive) heart failure: Secondary | ICD-10-CM | POA: Diagnosis present

## 2022-12-26 DIAGNOSIS — I441 Atrioventricular block, second degree: Secondary | ICD-10-CM | POA: Diagnosis not present

## 2022-12-26 DIAGNOSIS — K219 Gastro-esophageal reflux disease without esophagitis: Secondary | ICD-10-CM | POA: Diagnosis present

## 2022-12-26 DIAGNOSIS — Z79899 Other long term (current) drug therapy: Secondary | ICD-10-CM

## 2022-12-26 DIAGNOSIS — I472 Ventricular tachycardia, unspecified: Secondary | ICD-10-CM | POA: Diagnosis present

## 2022-12-26 DIAGNOSIS — I10 Essential (primary) hypertension: Secondary | ICD-10-CM | POA: Diagnosis present

## 2022-12-26 DIAGNOSIS — Z881 Allergy status to other antibiotic agents status: Secondary | ICD-10-CM

## 2022-12-26 LAB — HEMOGLOBIN AND HEMATOCRIT, BLOOD
HCT: 33.8 % — ABNORMAL LOW (ref 39.0–52.0)
Hemoglobin: 11.7 g/dL — ABNORMAL LOW (ref 13.0–17.0)

## 2022-12-26 LAB — CBC WITH DIFFERENTIAL/PLATELET
Abs Immature Granulocytes: 0.02 10*3/uL (ref 0.00–0.07)
Basophils Absolute: 0 10*3/uL (ref 0.0–0.1)
Basophils Relative: 0 %
Eosinophils Absolute: 0.1 10*3/uL (ref 0.0–0.5)
Eosinophils Relative: 1 %
HCT: 36.6 % — ABNORMAL LOW (ref 39.0–52.0)
Hemoglobin: 12.6 g/dL — ABNORMAL LOW (ref 13.0–17.0)
Immature Granulocytes: 0 %
Lymphocytes Relative: 12 %
Lymphs Abs: 0.9 10*3/uL (ref 0.7–4.0)
MCH: 29.2 pg (ref 26.0–34.0)
MCHC: 34.4 g/dL (ref 30.0–36.0)
MCV: 84.9 fL (ref 80.0–100.0)
Monocytes Absolute: 0.3 10*3/uL (ref 0.1–1.0)
Monocytes Relative: 5 %
Neutro Abs: 5.9 10*3/uL (ref 1.7–7.7)
Neutrophils Relative %: 82 %
Platelets: 113 10*3/uL — ABNORMAL LOW (ref 150–400)
RBC: 4.31 MIL/uL (ref 4.22–5.81)
RDW: 13.6 % (ref 11.5–15.5)
WBC: 7.2 10*3/uL (ref 4.0–10.5)
nRBC: 0 % (ref 0.0–0.2)

## 2022-12-26 LAB — COMPREHENSIVE METABOLIC PANEL
ALT: 35 U/L (ref 0–44)
AST: 34 U/L (ref 15–41)
Albumin: 3.8 g/dL (ref 3.5–5.0)
Alkaline Phosphatase: 54 U/L (ref 38–126)
Anion gap: 8 (ref 5–15)
BUN: 9 mg/dL (ref 6–20)
CO2: 24 mmol/L (ref 22–32)
Calcium: 8.6 mg/dL — ABNORMAL LOW (ref 8.9–10.3)
Chloride: 102 mmol/L (ref 98–111)
Creatinine, Ser: 0.85 mg/dL (ref 0.61–1.24)
GFR, Estimated: >60 mL/min (ref >60)
Glucose, Bld: 123 mg/dL — ABNORMAL HIGH (ref 70–99)
Potassium: 3.7 mmol/L (ref 3.5–5.1)
Sodium: 134 mmol/L — ABNORMAL LOW (ref 135–145)
Total Bilirubin: 1.2 mg/dL (ref 0.3–1.2)
Total Protein: 7.4 g/dL (ref 6.5–8.1)

## 2022-12-26 LAB — TROPONIN I (HIGH SENSITIVITY)
Troponin I (High Sensitivity): 22 ng/L — ABNORMAL HIGH (ref <18)
Troponin I (High Sensitivity): 22 ng/L — ABNORMAL HIGH (ref <18)

## 2022-12-26 MED ORDER — SPIRONOLACTONE 25 MG PO TABS
25.0000 mg | ORAL_TABLET | Freq: Every day | ORAL | Status: DC
  Filled 2022-12-26 (×2): qty 1

## 2022-12-26 MED ORDER — POLYETHYLENE GLYCOL 3350 17 G PO PACK: 17.0000 g | PACK | Freq: Every day | ORAL | Status: DC | PRN

## 2022-12-26 MED ORDER — SOTALOL HCL 80 MG PO TABS
160.0000 mg | ORAL_TABLET | Freq: Two times a day (BID) | ORAL | Status: DC
  Administered 2022-12-26 – 2022-12-28 (×4): 160 mg via ORAL
  Filled 2022-12-26 (×7): qty 2

## 2022-12-26 MED ORDER — SODIUM CHLORIDE 0.9 % IV SOLN
2.0000 g | INTRAVENOUS | Status: DC
  Administered 2022-12-27: 2 g via INTRAVENOUS
  Filled 2022-12-26: qty 20

## 2022-12-26 MED ORDER — SACUBITRIL-VALSARTAN 97-103 MG PO TABS
1.0000 | ORAL_TABLET | Freq: Two times a day (BID) | ORAL | Status: DC
  Administered 2022-12-26 – 2022-12-28 (×4): 1 via ORAL
  Filled 2022-12-26 (×6): qty 1

## 2022-12-26 MED ORDER — SODIUM CHLORIDE 0.9 % IV SOLN
500.0000 mg | Freq: Once | INTRAVENOUS | Status: AC
  Administered 2022-12-26: 500 mg via INTRAVENOUS
  Filled 2022-12-26: qty 5

## 2022-12-26 MED ORDER — PANTOPRAZOLE SODIUM 40 MG PO TBEC
40.0000 mg | DELAYED_RELEASE_TABLET | Freq: Every day | ORAL | Status: DC
  Administered 2022-12-27 – 2022-12-28 (×2): 40 mg via ORAL
  Filled 2022-12-26 (×2): qty 1

## 2022-12-26 MED ORDER — CARVEDILOL 12.5 MG PO TABS
25.0000 mg | ORAL_TABLET | Freq: Two times a day (BID) | ORAL | Status: DC
  Administered 2022-12-26 – 2022-12-28 (×4): 25 mg via ORAL
  Filled 2022-12-26 (×4): qty 2

## 2022-12-26 MED ORDER — ACETAMINOPHEN 325 MG PO TABS
650.0000 mg | ORAL_TABLET | Freq: Four times a day (QID) | ORAL | Status: DC | PRN
  Administered 2022-12-26 – 2022-12-27 (×2): 650 mg via ORAL
  Filled 2022-12-26 (×2): qty 2

## 2022-12-26 MED ORDER — ACETAMINOPHEN 650 MG RE SUPP: 650.0000 mg | Freq: Four times a day (QID) | RECTAL | Status: DC | PRN

## 2022-12-26 MED ORDER — SODIUM CHLORIDE 0.9 % IV SOLN
100.0000 mg | Freq: Two times a day (BID) | INTRAVENOUS | Status: DC
  Administered 2022-12-27: 100 mg via INTRAVENOUS
  Filled 2022-12-26 (×2): qty 100

## 2022-12-26 MED ORDER — SODIUM CHLORIDE 0.9 % IV SOLN
1.0000 g | Freq: Once | INTRAVENOUS | Status: AC
  Administered 2022-12-26: 1 g via INTRAVENOUS
  Filled 2022-12-26: qty 10

## 2022-12-26 MED ORDER — IOHEXOL 350 MG/ML SOLN
100.0000 mL | Freq: Once | INTRAVENOUS | Status: AC | PRN
  Administered 2022-12-26: 100 mL via INTRAVENOUS

## 2022-12-26 NOTE — Assessment & Plan Note (Signed)
Stable. -Resume carvedilol, Delene Loll

## 2022-12-26 NOTE — ED Notes (Signed)
Admitting Dr in to see pt

## 2022-12-26 NOTE — ED Provider Notes (Signed)
[facility] Provider Note   CSN: [id number] Arrival date & time: 12/26/22  1412     History  Chief Complaint  Patient presents with   Hematemesis   HPI Brett Marquez is a 42 y.o. 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  States that earlier this morning he started coughing with very persistently lasted for about 2 hours and then he coughed up blood. The blood is bright red. It happened 1 time. Then happened again when he arrived here at the ED.  Denies shortness of breath and chest pain.  On Friday, did have his ICD battery replaced.  Was discharged later that day.  States he still has some tenderness in his chest near his ICD.  States that he does remember having a sore throat after the procedure.  Denies fatigue and lightheadedness.   HPI     Home Medications Prior to Admission medications   Medication Sig Start Date End Date Taking? Authorizing Provider  carvedilol (COREG) 25 MG tablet Take 25 mg by mouth 2 (two) times daily. 02/23/22   [provider]  ENTRESTO 97-103 MG TAKE (1) TABLET BY MOUTH TWICE DAILY. 12/22/22   [name], DO  eplerenone (INSPRA) 25 MG tablet Take 1 tablet (25 mg total) by mouth daily. 10/26/22   [name], DO  omeprazole (PRILOSEC) 20 MG capsule Take 20 mg by mouth daily. 07/31/14   [provider]  sotalol (BETAPACE) 160 MG tablet Take 160 mg by mouth 2 (two) times daily. 02/10/22   [provider]      Allergies    Hydrocodone-acetaminophen    Review of Systems   Review of Systems  Respiratory:         Hemoptysis    Physical Exam   Vitals:   12/26/22 1800 12/26/22 1900  BP: 139/67 130/68  Pulse: 79 72  Resp: (!) 28 (!) 36  Temp:    SpO2: 92% 91%    CONSTITUTIONAL:  well-appearing, NAD NEURO:  Alert and oriented x 3, CN 3-12 grossly intact EYES:  eyes equal and reactive ENT/NECK:   Supple, no stridor  CARDIO:  regular rate and rhythm, appears well-perfused Chest: Bandage with Tegaderm overlying the left upper quadrant of the chest.  No erythema, swelling or oozing or bleeding noted.  PULM:  No respiratory distress, CTAB GI/GU:  non-distended MSK/SPINE:  No gross deformities, no edema, moves all extremities  SKIN:  no rash, atraumatic   *Additional and/or pertinent findings included in MDM below    ED Results / Procedures / Treatments   Labs (all labs ordered are listed, but only abnormal results are displayed) Labs Reviewed  CBC WITH DIFFERENTIAL/PLATELET - Abnormal; Notable for the following components:      Result Value   Hemoglobin 12.6 (*)    HCT 36.6 (*)    Platelets 113 (*)    All other components within normal limits  COMPREHENSIVE METABOLIC PANEL - Abnormal; Notable for the following components:   Sodium 134 (*)    Glucose, Bld 123 (*)    Calcium 8.6 (*)    All other components within normal limits  TROPONIN I (HIGH SENSITIVITY) - Abnormal; Notable for the following components:   Troponin I (High Sensitivity) 22 (*)    All other components within normal limits  TROPONIN I (HIGH SENSITIVITY) - Abnormal; Notable for the following components:   Troponin I (High  Sensitivity) 22 (*)    All other components within normal limits    EKG None  Radiology CT Angio Chest PE W/Cm &/Or Wo Cm  Result Date: 12/26/2022 CLINICAL DATA:  Blood-tinged cough/emesis EXAM: CT ANGIOGRAPHY CHEST WITH CONTRAST TECHNIQUE: Multidetector CT imaging of the chest was performed using the standard protocol during bolus administration of intravenous contrast. Multiplanar CT image reconstructions and MIPs were obtained to evaluate the vascular anatomy. RADIATION DOSE REDUCTION: This exam was performed according to the departmental dose-optimization program which includes automated exposure control, adjustment of the mA and/or kV according to patient size and/or use of iterative  reconstruction technique. CONTRAST:  141mL OMNIPAQUE IOHEXOL 350 MG/ML SOLN COMPARISON:  CT chest dated 09/09/2014, chest radiograph dated 12/26/2022 FINDINGS: Cardiovascular: The study is adequate quality for the evaluation of pulmonary embolism the level of the proximal segmental arteries. There are no filling defects in the central, lobar, or segmental pulmonary artery branches to suggest acute pulmonary embolism. Transposition of the great arteries status post Mustard procedure. No significant pericardial fluid/thickening. Left chest wall ICD leads terminate in the morphologic left atrium and ventricle. Mediastinum/Nodes: Imaged thyroid gland without nodules meeting criteria for imaging follow-up by size. Normal esophagus. Scattered prominent subcentimeter lymph nodes with 1.0 cm subcarinal lymph node (5:51). Lungs/Pleura: The central airways are patent. Consolidative opacities and ground-glass nodules in the right upper and bilateral lower lobes. No pneumothorax. No pleural effusion. Upper abdomen: Cholecystectomy. Enlarged spleen in the AP dimension measures 16.0 cm, unchanged Musculoskeletal: No acute or abnormal lytic or blastic osseous lesions. Median sternotomy wires are nondisplaced. Review of the MIP images confirms the above findings. IMPRESSION: 1. Consolidative opacities and ground-glass nodules in the right upper and bilateral lower lobes, suspicious for multifocal pneumonia. Superimposed alveolar hemorrhage can have a similar appearance. 2. No evidence of pulmonary embolism. 3. Stable splenomegaly. 4. Transposition of the great arteries status post Mustard procedure. Left chest wall ICD leads terminate in the morphologic left atrium and ventricle. Electronically Signed   By: Darrin Nipper M.D.   On: 12/26/2022 17:43   DG Chest Port 1 View  Result Date: 12/26/2022 CLINICAL DATA:  Cough, vomiting, short of breath EXAM: PORTABLE CHEST 1 VIEW COMPARISON:  12/25/2022 FINDINGS: Single frontal view of the  chest demonstrates multi lead pacer/AICD unchanged. The cardiac silhouette remains enlarged. Peripheral rounded airspace disease within the right midlung zone. No effusion or pneumothorax. No acute bony abnormalities. IMPRESSION: 1. New area of peripheral consolidation within the right mid lung zone, consistent with hemorrhage or infection. 2. Stable enlarged cardiac silhouette. Electronically Signed   By: Randa Ngo M.D.   On: 12/26/2022 14:43   DG Chest Port 1 View  Result Date: 12/25/2022 CLINICAL DATA:  Possible sepsis EXAM: PORTABLE CHEST 1 VIEW COMPARISON:  06/27/2017 FINDINGS: Defibrillator is again seen. Cardiac shadow is enlarged. The lungs are well aerated bilaterally. No focal infiltrate or effusion is seen. No bony abnormality is noted. IMPRESSION: No active disease. Electronically Signed   By: Inez Catalina M.D.   On: 12/25/2022 02:27    Procedures Procedures    Medications Ordered in ED Medications  cefTRIAXone (ROCEPHIN) 1 g in sodium chloride 0.9 % 100 mL IVPB (1 g Intravenous New Bag/Given 12/26/22 1909)  azithromycin (ZITHROMAX) 500 mg in sodium chloride 0.9 % 250 mL IVPB (has no administration in time range)  iohexol (OMNIPAQUE) 350 MG/ML injection 100 mL (100 mLs Intravenous Contrast Given 12/26/22 1713)    ED Course/ Medical Decision Making/ A&P Clinical Course as  of 12/26/22 Jani Gravel Dec 26, 2022  1810 DG Chest [place] 1 View [JR]    Clinical Course User Index [JR] Harriet Pho, PA-C                             Medical Decision Making Amount and/or Complexity of Data Reviewed Radiology: ordered.  Risk Prescription drug management.   Initial Impression and Ddx 42 year old male who is well-appearing presenting for hemoptysis.  Exam unremarkable.  DDx includes PE, aspiration pneumonia, bronchitis, and symptomatic anemia. 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  Spoke to Dr. Vallarie Mare of pulmonology who advised that it is likely not alveolar hemorrhage but would strongly recommend that we admit him to the hospital for observation did hemoptysis get worse which to him would be an indication that it is likely hemorrhage.  Treated pneumonia with ceftriaxone azithromycin.  Admitted to hospital service with Dr. Arlyce Dice.  Patient management required discussion with the following services or consulting groups:  Hospitalist Service, Pulmonology  Complexity of Problems Addressed Acute complicated illness or Injury  Additional Data Reviewed and Analyzed Further history obtained from: Past medical history and medications listed in the EMR and Prior ED visit notes  Patient Encounter Risk Assessment Consideration of hospitalization         Final Clinical Impression(s) / ED Diagnoses Final diagnoses:  Hemoptysis    Rx / DC Orders ED Discharge Orders     None         Harriet Pho, PA-C 12/26/22 1920    Hayden Rasmussen, MD 12/27/22 1821

## 2022-12-26 NOTE — H&P (Addendum)
History and Physical    Brett Marquez [id number] DOB: Mar 15, 1981 DOA: 12/26/2022  PCP: [name], DO   Patient coming from: Home  I have personally briefly reviewed patient's old medical records in [place]  Chief Complaint: Coughing blood  HPI: Brett Marquez is a 42 y.o. 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 Patient presented to the ED with complaints of coughing.  Reports at about 8 AM this morning he started coughing, and was coughing for about 2 hours continuously.  Then he started coughing up blood.  He has had about 3-4 episodes of coughing up blood since onset.  The last time he coughed here in the ED there was no blood.  Amount has mostly been nickel-sized, but he had 1 episode where it was more. He is not on blood thinners or antiplatelets.  No chest pain.  No difficulty breathing.  Patient had replacement of his ICD this past Friday 3/29 at [facility].  Later that day after the procedure he had a fever, spouse at bedside said temperature was up to 101.  Spouse gave him Tylenol before he came to the ED.  Blood work vitals and workup checked out so he was discharged.   Patient does not know if he had a tube down his throat for the procedure, but he reports on waking up his throat was very sore.  He has not had worsening pain or discoloration from his ICD pocket.  ED Course: Temperature 98.3.  Heart rate 70s to 80s.  Respiratory 20-36.  Blood pressure systolic [id number] to [id number].  O2 sats 91 to 97% on room air. WBC 7.2.  Troponin 22 > 122.   Chest x-ray-new area of peripheral consolidation within the right midlung, consistent with hemorrhage or infection, subsequent CTA chest-suggests multifocal pneumonia, but superimposed alveolar hemorrhage can have similar appearance. EDP Dr. Tacy Learn, pulmonologist on-call at [facility], felt this was more likely  pneumonia, less likely alveolar hemorrhage, admit for observation, if hemoptysis gets worse likely hemorrhage.  Okay to admit here at [facility].  Review of Systems: As per HPI all other systems reviewed and negative.  Past Medical History:  Diagnosis Date   Cardiac defibrillator in place    Heart failure [facility])    Hypertension    Transposition great arteries     Past Surgical History:  Procedure Laterality Date   CARDIAC DEFIBRILLATOR PLACEMENT     CHOLECYSTECTOMY       reports that he has never smoked. He has quit using smokeless tobacco. He reports that he does not drink alcohol and does not use drugs.  Allergies  Allergen Reactions   Hydrocodone-Acetaminophen Nausea And Vomiting    Pt reports he gets nausea, but is not allergic    Family History  Problem Relation Age of Onset   Healthy Mother    Healthy Father     Prior to Admission medications   Medication Sig Start Date End Date Taking? Authorizing Provider  carvedilol (COREG) 25 MG tablet Take 25 mg by mouth 2 (two) times daily. 02/23/22   [provider]  ENTRESTO 97-103 MG TAKE (1) TABLET BY MOUTH TWICE DAILY. 12/22/22   [name], DO  eplerenone (INSPRA) 25 MG tablet Take 1 tablet (25 mg total) by mouth daily. 10/26/22   [name], DO  omeprazole (PRILOSEC) 20 MG capsule Take 20 mg by mouth daily.  07/31/14   [provider]  sotalol (BETAPACE) 160 MG tablet Take 160 mg by mouth 2 (two) times daily. 02/10/22   [provider]    Physical Exam: Vitals:   12/26/22 1630 12/26/22 1700 12/26/22 1730 12/26/22 1800  BP: 126/65 131/67 (!) 150/77 139/67  Pulse: 85 78 83 79  Resp: (!) 23 (!) 29 20 (!) 28  Temp:      TempSrc:      SpO2: 93% 91% 95% 92%    Constitutional: NAD, calm, comfortable Vitals:   12/26/22 1630 12/26/22 1700 12/26/22 1730 12/26/22 1800  BP: 126/65 131/67 (!) 150/77 139/67  Pulse: 85 78 83 79  Resp: (!) 23 (!) 29 20 (!) 28  Temp:      TempSrc:      SpO2: 93%  91% 95% 92%   Eyes: PERRL, lids and conjunctivae normal ENMT: Mucous membranes are moist. Neck: normal, supple, no masses, no thyromegaly Respiratory: clear to auscultation bilaterally, no wheezing, no crackles. Normal respiratory effort. No accessory muscle use.  Cardiovascular: Regular rate and rhythm, no murmurs / rubs / gallops. No extremity edema.  Extremities warm.  Abdomen: no tenderness, no masses palpated. No hepatosplenomegaly. Bowel sounds positive.  Musculoskeletal: no clubbing / cyanosis. No joint deformity upper and lower extremities.  Skin: Recent ICD replacement left upper chest, clean dressing to site, slight erythema underneath transparent dressing, appears appropriate for recent procedure, appropriate tenderness, no discharge. Neurologic: Moving extremities spontaneously, speech clear fluent without aphasia, no facial asymmetry.Marland Kitchen  Psychiatric: Normal judgment and insight. Alert and oriented x 3. Normal mood.   Labs on Admission: I have personally reviewed following labs and imaging studies  CBC: Recent Labs  Lab 12/25/22 0157 12/26/22 1456  WBC 4.0 7.2  NEUTROABS 2.7 5.9  HGB 11.8* 12.6*  HCT 34.8* 36.6*  MCV 85.3 84.9  PLT 98* [id number]*   Basic Metabolic Panel: Recent Labs  Lab 12/25/22 0157 12/26/22 1456  NA 136 134*  K 3.2* 3.7  CL 104 102  CO2 24 24  GLUCOSE 156* 123*  BUN 12 9  CREATININE 0.94 0.85  CALCIUM 8.2* 8.6*   GFR: Estimated Creatinine Clearance: 157.7 mL/min (by C-G formula based on SCr of 0.85 mg/dL). Liver Function Tests: Recent Labs  Lab 12/25/22 0157 12/26/22 1456  AST 39 34  ALT 41 35  ALKPHOS 52 54  BILITOT 1.0 1.2  PROT 6.7 7.4  ALBUMIN 3.6 3.8   Coagulation Profile: Recent Labs  Lab 12/25/22 0157  INR 1.1    Radiological Exams on Admission: CT Angio Chest PE W/Cm &/Or Wo Cm  Result Date: 12/26/2022 CLINICAL DATA:  Blood-tinged cough/emesis EXAM: CT ANGIOGRAPHY CHEST WITH CONTRAST TECHNIQUE: Multidetector CT imaging  of the chest was performed using the standard protocol during bolus administration of intravenous contrast. Multiplanar CT image reconstructions and MIPs were obtained to evaluate the vascular anatomy. RADIATION DOSE REDUCTION: This exam was performed according to the departmental dose-optimization program which includes automated exposure control, adjustment of the mA and/or kV according to patient size and/or use of iterative reconstruction technique. CONTRAST:  132mL OMNIPAQUE IOHEXOL 350 MG/ML SOLN COMPARISON:  CT chest dated 09/09/2014, chest radiograph dated 12/26/2022 FINDINGS: Cardiovascular: The study is adequate quality for the evaluation of pulmonary embolism the level of the proximal segmental arteries. There are no filling defects in the central, lobar, or segmental pulmonary artery branches to suggest acute pulmonary embolism. Transposition of the great arteries status post Mustard procedure. No significant pericardial fluid/thickening. Left chest wall  ICD leads terminate in the morphologic left atrium and ventricle. Mediastinum/Nodes: Imaged thyroid gland without nodules meeting criteria for imaging follow-up by size. Normal esophagus. Scattered prominent subcentimeter lymph nodes with 1.0 cm subcarinal lymph node (5:51). Lungs/Pleura: The central airways are patent. Consolidative opacities and ground-glass nodules in the right upper and bilateral lower lobes. No pneumothorax. No pleural effusion. Upper abdomen: Cholecystectomy. Enlarged spleen in the AP dimension measures 16.0 cm, unchanged Musculoskeletal: No acute or abnormal lytic or blastic osseous lesions. Median sternotomy wires are nondisplaced. Review of the MIP images confirms the above findings. IMPRESSION: 1. Consolidative opacities and ground-glass nodules in the right upper and bilateral lower lobes, suspicious for multifocal pneumonia. Superimposed alveolar hemorrhage can have a similar appearance. 2. No evidence of pulmonary embolism.  3. Stable splenomegaly. 4. Transposition of the great arteries status post Mustard procedure. Left chest wall ICD leads terminate in the morphologic left atrium and ventricle. Electronically Signed   By: Darrin Nipper M.D.   On: 12/26/2022 17:43   DG Chest Port 1 View  Result Date: 12/26/2022 CLINICAL DATA:  Cough, vomiting, short of breath EXAM: PORTABLE CHEST 1 VIEW COMPARISON:  12/25/2022 FINDINGS: Single frontal view of the chest demonstrates multi lead pacer/AICD unchanged. The cardiac silhouette remains enlarged. Peripheral rounded airspace disease within the right midlung zone. No effusion or pneumothorax. No acute bony abnormalities. IMPRESSION: 1. New area of peripheral consolidation within the right mid lung zone, consistent with hemorrhage or infection. 2. Stable enlarged cardiac silhouette. Electronically Signed   By: Randa Ngo M.D.   On: 12/26/2022 14:43   DG Chest Port 1 View  Result Date: 12/25/2022 CLINICAL DATA:  Possible sepsis EXAM: PORTABLE CHEST 1 VIEW COMPARISON:  06/27/2017 FINDINGS: Defibrillator is again seen. Cardiac shadow is enlarged. The lungs are well aerated bilaterally. No focal infiltrate or effusion is seen. No bony abnormality is noted. IMPRESSION: No active disease. Electronically Signed   By: Inez Catalina M.D.   On: 12/25/2022 02:27    EKG: Independently reviewed.  AV sequential or dual-chamber electronic pacemaker.  Rate 70, QTc 451.  No significant change from prior.  Assessment/Plan Principal Problem:   PNA (pneumonia) Active Problems:   D-TGA (dextro-transposition of great arteries)   Congestive heart failure (HCC)   AV block, 2nd degree   Biventricular implantable cardioverter-defibrillator in situ   Hypertension  Assessment and Plan: * PNA (pneumonia) Presenting with hemoptysis.  Tachypneic, respiratory rate 16-36, otherwise stable vitals.  Sats 91 to 97% on room air.  Afebrile without leukocytosis..  Rules out for sepsis. Hgb stable. Chest  x-ray-new area of peripheral consolidation within the right midlung, consistent with hemorrhage or infection, subsequent CTA chest-suggests multifocal pneumonia, but superimposed alveolar hemorrhage can have similar appearance.  - EDP talked to pulmonologist Dr. Tacy Learn,- felt this was more likely pneumonia, less likely alveolar hemorrhage, admit for observation, if hemoptysis gets worse likely hemorrhage.  Okay to admit here at [facility]. -He is not on any form of antiplatelet or anticoagulation -IV ceftriaxone and doxycycline (QTc prolonged) - ICD replaced 3/29 symptoms started later that day.  He was sedated for procedure, but does not appear he was intubated.  Patient may have aspirated.  Hypertension Stable. -Resume carvedilol, Entresto  Biventricular implantable cardioverter-defibrillator in situ ICD replaced 3/29 at [facility].  Slight erythema present, but appropriate for recent procedure, no signs of infection.  Congestive heart failure (HCC) Systolic CHF.  Presently stable and compensated.  Not on diuretics.  Care Everywhere last TTE (11/30/22): systemic ventricle (  RV) with severe enlargement and severe systolic dysfxn, pulmonary ventricle (LV) is small with normal fxn, PV (from LV/pulmonary ventricle) with mild regurgitation and no stenosis. -Resume Entresto, eplerenone,  -He is on both carvedilol and sotalol-I confirmed with patient, will resume    DVT prophylaxis:  SCDS Code Status:  Full- confirmed with patient and spouse at bedside Family Communication: Spouse at bedside Disposition Plan: ~ 1-2 days Consults called: None Admission status: Obs tele  Author: Bethena Roys, MD 12/26/2022 7:57 PM  For on call review [email or web address].

## 2022-12-26 NOTE — ED Notes (Signed)
Pt states he has liquid coming up into his throat causing gurgling and him to cough   especially if he lays down

## 2022-12-26 NOTE — Assessment & Plan Note (Addendum)
Presenting with hemoptysis.  Tachypneic, respiratory rate 16-36, otherwise stable vitals.  Sats 91 to 97% on room air.  Afebrile without leukocytosis..  Rules out for sepsis. Hgb stable. Chest x-ray-new area of peripheral consolidation within the right midlung, consistent with hemorrhage or infection, subsequent CTA chest-suggests multifocal pneumonia, but superimposed alveolar hemorrhage can have similar appearance.  - EDP talked to pulmonologist Dr. Tacy Learn,- felt this was more likely pneumonia, less likely alveolar hemorrhage, admit for observation, if hemoptysis gets worse likely hemorrhage.  Okay to admit here at [facility]. -He is not on any form of antiplatelet or anticoagulation -IV ceftriaxone and doxycycline (QTc prolonged) - ICD replaced 3/29 symptoms started later that day.  He was sedated for procedure, but does not appear he was intubated.  Patient may have aspirated.

## 2022-12-26 NOTE — ED Notes (Signed)
Pt resting well.  No acute distress.  No cough

## 2022-12-26 NOTE — Assessment & Plan Note (Addendum)
Systolic CHF.  Presently stable and compensated.  Not on diuretics.  Care Everywhere last TTE (11/30/22): systemic ventricle (RV) with severe enlargement and severe systolic dysfxn, pulmonary ventricle (LV) is small with normal fxn, PV (from LV/pulmonary ventricle) with mild regurgitation and no stenosis. -Resume Entresto, eplerenone,  -He is on both carvedilol and sotalol-I confirmed with patient, will resume

## 2022-12-26 NOTE — ED Triage Notes (Signed)
Pt states coughing and vomited x 1 with blood starting this morning.  Denies taking any blood thinners. Recent pacemaker battery changed, with pain to site.

## 2022-12-26 NOTE — Assessment & Plan Note (Addendum)
ICD replaced 3/29 at [facility].  Slight erythema present, but appropriate for recent procedure, no signs of infection.

## 2022-12-27 ENCOUNTER — Observation Stay (HOSPITAL_COMMUNITY): Payer: Medicare Other

## 2022-12-27 DIAGNOSIS — Q203 Discordant ventriculoarterial connection: Secondary | ICD-10-CM | POA: Diagnosis not present

## 2022-12-27 DIAGNOSIS — Z8679 Personal history of other diseases of the circulatory system: Secondary | ICD-10-CM

## 2022-12-27 DIAGNOSIS — R042 Hemoptysis: Secondary | ICD-10-CM

## 2022-12-27 DIAGNOSIS — R0489 Hemorrhage from other sites in respiratory passages: Secondary | ICD-10-CM

## 2022-12-27 DIAGNOSIS — R778 Other specified abnormalities of plasma proteins: Secondary | ICD-10-CM | POA: Diagnosis not present

## 2022-12-27 DIAGNOSIS — J690 Pneumonitis due to inhalation of food and vomit: Secondary | ICD-10-CM

## 2022-12-27 DIAGNOSIS — J189 Pneumonia, unspecified organism: Secondary | ICD-10-CM | POA: Diagnosis not present

## 2022-12-27 DIAGNOSIS — Z8774 Personal history of (corrected) congenital malformations of heart and circulatory system: Secondary | ICD-10-CM

## 2022-12-27 LAB — CBC
HCT: 34.2 % — ABNORMAL LOW (ref 39.0–52.0)
Hemoglobin: 11.6 g/dL — ABNORMAL LOW (ref 13.0–17.0)
MCH: 29.1 pg (ref 26.0–34.0)
MCHC: 33.9 g/dL (ref 30.0–36.0)
MCV: 85.9 fL (ref 80.0–100.0)
Platelets: 99 10*3/uL — ABNORMAL LOW (ref 150–400)
RBC: 3.98 MIL/uL — ABNORMAL LOW (ref 4.22–5.81)
RDW: 13.9 % (ref 11.5–15.5)
WBC: 5.7 10*3/uL (ref 4.0–10.5)
nRBC: 0 % (ref 0.0–0.2)

## 2022-12-27 LAB — HIV ANTIBODY (ROUTINE TESTING W REFLEX): HIV Screen 4th Generation wRfx: NONREACTIVE

## 2022-12-27 LAB — BASIC METABOLIC PANEL
Anion gap: 11 (ref 5–15)
BUN: 9 mg/dL (ref 6–20)
CO2: 23 mmol/L (ref 22–32)
Calcium: 8.4 mg/dL — ABNORMAL LOW (ref 8.9–10.3)
Chloride: 103 mmol/L (ref 98–111)
Creatinine, Ser: 0.82 mg/dL (ref 0.61–1.24)
GFR, Estimated: >60 mL/min (ref >60)
Glucose, Bld: 96 mg/dL (ref 70–99)
Potassium: 3.2 mmol/L — ABNORMAL LOW (ref 3.5–5.1)
Sodium: 137 mmol/L (ref 135–145)

## 2022-12-27 MED ORDER — POTASSIUM CHLORIDE CRYS ER 20 MEQ PO TBCR
40.0000 meq | EXTENDED_RELEASE_TABLET | Freq: Once | ORAL | Status: AC
  Administered 2022-12-27: 40 meq via ORAL
  Filled 2022-12-27: qty 2

## 2022-12-27 MED ORDER — MELATONIN 3 MG PO TABS
6.0000 mg | ORAL_TABLET | Freq: Every day | ORAL | Status: DC
  Administered 2022-12-27: 6 mg via ORAL
  Filled 2022-12-27: qty 2

## 2022-12-27 NOTE — Progress Notes (Addendum)
Was called into pt's room with c/o of pt feeling cold. Pt also states, "I started to have a metal taste in my mouth, increased coughing, with a burning/cool sensation around my IV site after starting doxycycline infusion." IV site flushes well with no signs of infiltration. Infusion stopped at 1825. Pt also has oral temp of 101.9. MD notified. Administered PRN tylenol and will continue to monitor.

## 2022-12-27 NOTE — Progress Notes (Signed)
Patient arrived to room 306 around 2026. He is alert and oriented x4 and his able to make his needs known. Educated patient about call light, unit, incentive spirometer, SCD's. I answered all of patients questions. I explained to patient that we are monitoring his labs, his sputum, and he is receiving IV abx. Patient coughed up small amount of pink tinged sputum x2 during this shift. VS are WNL. Patient has no c/o at this time.

## 2022-12-27 NOTE — Care Management Obs Status (Signed)
[place] NOTIFICATION   Patient Details  Name: Brett Marquez MRN: [id number] Date of Birth: 1981-08-12   Medicare Observation Status Notification Given:  Yes    Tommy Medal 12/27/2022, 2:30 PM

## 2022-12-27 NOTE — TOC Progression Note (Signed)
Transition of Care [facility]) - Progression Note    Patient Details  Name: Brett Marquez MRN: [id number] Date of Birth: Jan 25, 1981  Transition of [facility]) CM/SW Contact  Salome Arnt, [place] Phone Number: 12/27/2022, 10:07 AM  Clinical Narrative:  Transition of Care (TOC) Screening Note   Patient Details  Name: Brett Marquez Date of Birth: 03-02-1981   Transition of Care [facility]) CM/SW Contact:    Salome Arnt, [place] Phone Number: 12/27/2022, 10:08 AM    Transition of Care Department [facility]) has reviewed patient and no TOC needs have been identified at this time. We will continue to monitor patient advancement through interdisciplinary progression rounds. If new patient transition needs arise, please place a TOC consult.             Expected Discharge Plan and Services                                               Social Determinants of Health (SDOH) Interventions SDOH Screenings   Food Insecurity: No Food Insecurity (12/26/2022)  Housing: Low Risk  (12/26/2022)  Transportation Needs: No Transportation Needs (12/26/2022)  Utilities: Not At Risk (12/26/2022)  Depression (PHQ2-9): Low Risk  (11/12/2022)  Financial Resource Strain: Low Risk  (11/12/2022)  Physical Activity: Inactive (11/12/2022)  Social Connections: Moderately Integrated (11/12/2022)  Stress: No Stress Concern Present (11/12/2022)  Tobacco Use: Medium Risk (12/26/2022)    Readmission Risk Interventions     No data to display

## 2022-12-27 NOTE — Progress Notes (Signed)
   12/27/22 1822  Assess: MEWS Score  Temp (!) 101.9 F (38.8 C)  BP (!) 155/80  MAP (mmHg) 99  Pulse Rate 79  Resp 20  Level of Consciousness Alert  SpO2 96 %  O2 Device Room Air  Assess: MEWS Score  MEWS Temp 2  MEWS Systolic 0  MEWS Pulse 0  MEWS RR 0  MEWS LOC 0  MEWS Score 2  MEWS Score Color Yellow  Assess: if the MEWS score is Yellow or Red  Were vital signs taken at a resting state? Yes  Focused Assessment Change from prior assessment (see assessment flowsheet)  Does the patient meet 2 or more of the SIRS criteria? Yes  Does the patient have a confirmed or suspected source of infection? Yes  Provider and Rapid Response Notified? Yes  MEWS guidelines implemented  Yes, yellow  Treat  MEWS Interventions Considered administering scheduled or prn medications/treatments as ordered  Take Vital Signs  Increase Vital Sign Frequency  Yellow: Q2hr x1, continue Q4hrs until patient remains green for 12hrs  Escalate  MEWS: Escalate Yellow: Discuss with charge nurse and consider notifying provider and/or RRT  Notify: Charge Nurse/RN  Name of Charge Nurse/RN Notified Alwyn Ren, RN  Provider Notification  Provider Name/Title Shemehdi  Date Provider Notified 12/27/22  Time Provider Notified 1829  Method of Notification Page (secure chat)  Notification Reason Change in status  Assess: SIRS CRITERIA  SIRS Temperature  1  SIRS Pulse 0  SIRS Respirations  0  SIRS WBC 0  SIRS Score Sum  1

## 2022-12-27 NOTE — Progress Notes (Signed)
PROGRESS NOTE    Patient: Brett Marquez                            PCP: [name], DO                    DOB: 10/15/1980            DOA: 12/26/2022 [id number]:[id number]             DOS: 12/27/2022, 10:59 AM   LOS: 0 days   Date of Service: The patient was seen and examined on 12/27/2022  Subjective:   The patient was seen and examined this morning. Hemodynamically stable. Cough with clear sputum with tinge of blood No issues overnight .  Brief Narrative:   ANDRIY PURSER is a 42 y.o. 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 Patient presented to the ED with complaints of coughing.  Reports at about 8 AM this morning he started coughing, and was coughing for about 2 hours continuously.  Then he started coughing up blood.  He has had about 3-4 episodes of coughing up blood since onset.  The last time he coughed here in the ED there was no blood.  Amount has mostly been nickel-sized, but he had 1 episode where it was more. He is not on blood thinners or antiplatelets.  No chest pain.  No difficulty breathing.  Patient had replacement of his ICD this past Friday 3/29 at [facility].  Later that day after the procedure he had a fever, spouse at bedside said temperature was up to 101.  Spouse gave him Tylenol before he came to the ED.  Blood work vitals and workup checked out so he was discharged.   Patient does not know if he had a tube down his throat for the procedure, but he reports on waking up his throat was very sore.  He has not had worsening pain or discoloration from his ICD pocket.   ED Course: Temperature 98.3.  Heart rate 70s to 80s.  Respiratory 20-36.  Blood pressure systolic [id number] to [id number].  O2 sats 91 to 97% on room air. WBC 7.2.  Troponin 22 > 122.   Chest x-ray-new area of peripheral consolidation within the right midlung, consistent with hemorrhage or  infection, subsequent CTA chest-suggests multifocal pneumonia, but superimposed alveolar hemorrhage can have similar appearance. EDP Dr. Tacy Learn, pulmonologist on-call at [facility], felt this was more likely pneumonia, less likely alveolar hemorrhage, admit for observation, if hemoptysis gets worse likely hemorrhage.  Okay to admit here at [facility].    Assessment & Plan:   Principal Problem:   PNA (pneumonia) Active Problems:   D-TGA (dextro-transposition of great arteries)   Congestive heart failure   AV block, 2nd degree   Biventricular implantable cardioverter-defibrillator in situ   Hypertension     Assessment and Plan: * PNA (pneumonia) - Presented with hemoptysis.  (Sputum changes with blood) -Sputum production clear white with blood-tinged sputum -Currently stable satting 99% on room air, WBC 5.7, lactic acid 1.5  -POA: Mild tachypnea, tachycardic, otherwise afebrile, normotensive, satting 97% on room air  Tachypneic, respiratory rate 16-36, otherwise stable vitals.  Sats 91 to 97% on room air.    Afebrile without leukocytosis.[name] out for sepsis.  Chest x-ray-new area of peripheral consolidation within the right midlung, consistent  with hemorrhage or infection, subsequent CTA chest-suggests multifocal pneumonia, but superimposed alveolar hemorrhage can have similar appearance.  - EDP talked to pulmonologist Dr. Tacy Learn,- felt this was more likely pneumonia, less likely alveolar hemorrhage,    -He is not on any form of antiplatelet or anticoagulation -IV ceftriaxone and doxycycline (QTc prolonged)  - ICD replaced 3/29 symptoms started later that day.  He was sedated for procedure, but does not appear he was intubated.  Patient may have aspirated.  Hypertension Stable. -Resume carvedilol, Entresto  Biventricular implantable cardioverter-defibrillator in situ ICD replaced 3/29 at [facility].  Slight erythema present, but appropriate for recent procedure, no signs of  infection.  Congestive heart failure Systolic CHF.   -Presently stable and compensated.  Not on diuretics.   Care Everywhere last TTE (11/30/22): systemic ventricle (RV) with severe enlargement and severe systolic dysfxn, pulmonary ventricle (LV) is small with normal fxn, PV (from LV/pulmonary ventricle) with mild regurgitation and no stenosis. -Resume Entresto, eplerenone,  -He is on both carvedilol and sotalol-I confirmed     -------------------------------------------------------------------------------------------------------------------------  Nutritional status:  The patient's BMI is: Body mass index is 38.04 kg/m. I agree with the assessment and plan as outlined below:  ----------------------------------------------------------------------------------------------------------------------- Cultures; Blood Cultures x 2 >> NGT  Sputum Culture >> NGT    ------------------------------------------------------------------------------------------------------------------------------------------------  DVT prophylaxis:  SCDs Start: 12/26/22 2021   Code Status:   Code Status: Full Code  Family Communication: No family member present at bedside- attempt will be made to update daily The above findings and plan of care has been discussed with patient (and family)  in detail,  they expressed understanding and agreement of above. -Advance care planning has been discussed.   Admission status:   Status is: Observation The patient remains OBS appropriate and will d/c before 2 midnights.   Disposition: From  - home             Planning for discharge in 1-2 days: to   Procedures:   No admission procedures for hospital encounter.   Antimicrobials:  Anti-infectives (From admission, onward)    Start     Dose/Rate Route Frequency Ordered Stop   12/27/22 1500  cefTRIAXone (ROCEPHIN) 2 g in sodium chloride 0.9 % 100 mL IVPB        2 g 200 mL/hr over 30 Minutes Intravenous Every  24 hours 12/26/22 2020 01/01/23 1459   12/27/22 1500  doxycycline (VIBRAMYCIN) 100 mg in sodium chloride 0.9 % 250 mL IVPB        100 mg 125 mL/hr over 120 Minutes Intravenous Every 12 hours 12/26/22 2020     12/26/22 2000  cefTRIAXone (ROCEPHIN) 1 g in sodium chloride 0.9 % 100 mL IVPB       Note to Pharmacy: Totaling 2 g, adjusted for weight and for treatment of pneumonia.   1 g 200 mL/hr over 30 Minutes Intravenous  Once 12/26/22 1957 12/26/22 2032   12/26/22 1845  cefTRIAXone (ROCEPHIN) 1 g in sodium chloride 0.9 % 100 mL IVPB        1 g 200 mL/hr over 30 Minutes Intravenous  Once 12/26/22 1844 12/26/22 1940   12/26/22 1845  azithromycin (ZITHROMAX) 500 mg in sodium chloride 0.9 % 250 mL IVPB        500 mg 250 mL/hr over 60 Minutes Intravenous  Once 12/26/22 1844 12/26/22 2023        Medication:   carvedilol  25 mg Oral BID   pantoprazole  40 mg Oral Daily  sacubitril-valsartan  1 tablet Oral BID   sotalol  160 mg Oral BID   spironolactone  25 mg Oral Daily    acetaminophen **OR** acetaminophen, polyethylene glycol   Objective:   Vitals:   12/26/22 2026 12/26/22 2028 12/27/22 0026 12/27/22 0430  BP: (!) 144/75 (!) 144/75 123/76 137/77  Pulse: 70 70 69 70  Resp: (!) 22 (!) 22 20 20   Temp: 98.7 F (37.1 C) 98.7 F (37.1 C) 99.1 F (37.3 C) 97.8 F (36.6 C)  TempSrc: Oral Oral Oral Oral  SpO2: 94%  92% 99%  Weight:  130.8 kg    Height:  6\' 1"  (1.854 m)      Intake/Output Summary (Last 24 hours) at 12/27/2022 1059 Last data filed at 12/27/2022 0400 Gross per 24 hour  Intake 100 ml  Output --  Net 100 ml   Filed Weights   12/26/22 2028  Weight: 130.8 kg     Physical examination:   Constitution:  Alert, cooperative, no distress,  Appears calm and comfortable  Psychiatric:   Normal and stable mood and affect, cognition intact,   HEENT:        Normocephalic, PERRL, otherwise with in Normal limits  Chest:         Chest symmetric Cardio vascular:  S1/S2, RRR,  No murmure, No Rubs or Gallops  pulmonary: Clear to auscultation bilaterally, respirations unlabored, negative wheezes / crackles Abdomen: Soft, non-tender, non-distended, bowel sounds,no masses, no organomegaly Muscular skeletal: Limited exam - in bed, able to move all 4 extremities,   Neuro: CNII-XII intact. , normal motor and sensation, reflexes intact  Extremities: No pitting edema lower extremities, +2 pulses  Skin: Dry, warm to touch, negative for any Rashes, No open wounds Wounds: per nursing documentation   ------------------------------------------------------------------------------------------------------------------------------------------    LABs:     Latest Ref Rng & Units 12/27/2022    4:47 AM 12/26/2022    9:43 PM 12/26/2022    2:56 PM  CBC  WBC 4.0 - 10.5 K/uL 5.7   7.2   Hemoglobin 13.0 - 17.0 g/dL 11.6  11.7  12.6   Hematocrit 39.0 - 52.0 % 34.2  33.8  36.6   Platelets 150 - 400 K/uL 99   113       Latest Ref Rng & Units 12/27/2022    4:47 AM 12/26/2022    2:56 PM 12/25/2022    1:57 AM  CMP  Glucose 70 - 99 mg/dL 96  123  156   BUN 6 - 20 mg/dL 9  9  12    Creatinine 0.61 - 1.24 mg/dL 0.82  0.85  0.94   Sodium 135 - 145 mmol/L 137  134  136   Potassium 3.5 - 5.1 mmol/L 3.2  3.7  3.2   Chloride 98 - 111 mmol/L 103  102  104   CO2 22 - 32 mmol/L 23  24  24    Calcium 8.9 - 10.3 mg/dL 8.4  8.6  8.2   Total Protein 6.5 - 8.1 g/dL  7.4  6.7   Total Bilirubin 0.3 - 1.2 mg/dL  1.2  1.0   Alkaline Phos 38 - 126 U/L  54  52   AST 15 - 41 U/L  34  39   ALT 0 - 44 U/L  35  41        Micro Results Recent Results (from the past 240 hour(s))  Blood Culture (routine x 2)     Status: None (Preliminary result)   Collection  Time: 12/25/22  1:57 AM   Specimen: Left Antecubital; Blood  Result Value Ref Range Status   Specimen Description   Final    LEFT ANTECUBITAL BOTTLES DRAWN AEROBIC AND ANAEROBIC   Special Requests Blood Culture adequate volume  Final   Culture    Final    NO GROWTH 2 DAYS Performed at [facility], [address] [place], [place] [address]    Report Status PENDING  Incomplete  Blood Culture (routine x 2)     Status: None (Preliminary result)   Collection Time: 12/25/22  2:30 AM   Specimen: BLOOD RIGHT HAND  Result Value Ref Range Status   Specimen Description BLOOD RIGHT HAND  Final   Special Requests   Final    BOTTLES DRAWN AEROBIC AND ANAEROBIC Blood Culture adequate volume   Culture   Final    NO GROWTH 2 DAYS Performed at [facility], [address] [place], [place] [address]    Report Status PENDING  Incomplete  SARS Coronavirus 2 by RT PCR (hospital order, performed in [place] hospital lab) *cepheid single result test* Anterior Nasal Swab     Status: None   Collection Time: 12/25/22  3:05 AM   Specimen: Anterior Nasal Swab  Result Value Ref Range Status   SARS Coronavirus 2 by RT PCR NEGATIVE NEGATIVE Final    Comment: (NOTE) SARS-CoV-2 target nucleic acids are NOT DETECTED.  The SARS-CoV-2 RNA is generally detectable in upper and lower respiratory specimens during the acute phase of infection. The lowest concentration of SARS-CoV-2 viral copies this assay can detect is 250 copies / mL. A negative result does not preclude SARS-CoV-2 infection and should not be used as the sole basis for treatment or other patient management decisions.  A negative result may occur with improper specimen collection / handling, submission of specimen other than nasopharyngeal swab, presence of viral mutation(s) within the areas targeted by this assay, and inadequate number of viral copies (<250 copies / mL). A negative result must be combined with clinical observations, patient history, and epidemiological information.  Fact Sheet for Patients:   https://www.patel.info/  Fact Sheet for Healthcare Providers: https://hall.com/  This test is not yet approved or  cleared by the [place] FDA and has been authorized for detection and/or diagnosis of SARS-CoV-2 by FDA under an Emergency Use Authorization (EUA).  This EUA will remain in effect (meaning this test can be used) for the duration of the COVID-19 declaration under Section 564(b)(1) of the Act, 21 U.S.C. section 360bbb-3(b)(1), unless the authorization is terminated or revoked sooner.  Performed at [facility], [address] [place], [place] [address]     Radiology Reports CT Angio Chest PE W/Cm &/Or Wo Cm  Result Date: 12/26/2022 CLINICAL DATA:  Blood-tinged cough/emesis EXAM: CT ANGIOGRAPHY CHEST WITH CONTRAST TECHNIQUE: Multidetector CT imaging of the chest was performed using the standard protocol during bolus administration of intravenous contrast. Multiplanar CT image reconstructions and MIPs were obtained to evaluate the vascular anatomy. RADIATION DOSE REDUCTION: This exam was performed according to the departmental dose-optimization program which includes automated exposure control, adjustment of the mA and/or kV according to patient size and/or use of iterative reconstruction technique. CONTRAST:  133mL OMNIPAQUE IOHEXOL 350 MG/ML SOLN COMPARISON:  CT chest dated 09/09/2014, chest radiograph dated 12/26/2022 FINDINGS: Cardiovascular: The study is adequate quality for the evaluation of pulmonary embolism the level of the proximal segmental arteries. There are no filling defects in the central, lobar, or segmental pulmonary artery  branches to suggest acute pulmonary embolism. Transposition of the great arteries status post Mustard procedure. No significant pericardial fluid/thickening. Left chest wall ICD leads terminate in the morphologic left atrium and ventricle. Mediastinum/Nodes: Imaged thyroid gland without nodules meeting criteria for imaging follow-up by size. Normal esophagus. Scattered prominent subcentimeter lymph nodes with 1.0 cm subcarinal lymph node (5:51). Lungs/Pleura: The central airways are  patent. Consolidative opacities and ground-glass nodules in the right upper and bilateral lower lobes. No pneumothorax. No pleural effusion. Upper abdomen: Cholecystectomy. Enlarged spleen in the AP dimension measures 16.0 cm, unchanged Musculoskeletal: No acute or abnormal lytic or blastic osseous lesions. Median sternotomy wires are nondisplaced. Review of the MIP images confirms the above findings. IMPRESSION: 1. Consolidative opacities and ground-glass nodules in the right upper and bilateral lower lobes, suspicious for multifocal pneumonia. Superimposed alveolar hemorrhage can have a similar appearance. 2. No evidence of pulmonary embolism. 3. Stable splenomegaly. 4. Transposition of the great arteries status post Mustard procedure. Left chest wall ICD leads terminate in the morphologic left atrium and ventricle. Electronically Signed   By: Darrin Nipper M.D.   On: 12/26/2022 17:43   DG Chest Port 1 View  Result Date: 12/26/2022 CLINICAL DATA:  Cough, vomiting, short of breath EXAM: PORTABLE CHEST 1 VIEW COMPARISON:  12/25/2022 FINDINGS: Single frontal view of the chest demonstrates multi lead pacer/AICD unchanged. The cardiac silhouette remains enlarged. Peripheral rounded airspace disease within the right midlung zone. No effusion or pneumothorax. No acute bony abnormalities. IMPRESSION: 1. New area of peripheral consolidation within the right mid lung zone, consistent with hemorrhage or infection. 2. Stable enlarged cardiac silhouette. Electronically Signed   By: Randa Ngo M.D.   On: 12/26/2022 14:43    SIGNED: Deatra James, MD, FHM. FAAFP. [facility] Pont[facility] - [facility] Time spent 55 min.  In seeing, evaluating and examining the patient. Reviewing medical records, labs, drawn plan of care. [facility],  Pager (please use [email or web address] to page/ text) Please use Epic Secure Chat for non-urgent communication (7AM-7PM)  If 7PM-7AM, please contact night-coverage [email or web address], 12/27/2022,  10:59 AM

## 2022-12-27 NOTE — Consult Note (Addendum)
Cardiology Consultation   Patient ID: Brett Marquez MRN: [id number]; DOB: 1981/03/04  Admit date: 12/26/2022 Date of Consult: 12/27/2022  PCP:  [name], DO   [facility] Cardiologist:  [facility]  Patient Profile:   Brett Marquez is a 42 y.o. male with a hx of dexto-transposition of great arteries, s/p Mustard operation, HFrEF, atypical AFL s/p attempted catheter ablation in 1996, HTN, second degree AV block, VT with ICD s/p CRTD upgrade in 2008 with generator change out in 2017 on sotalol and 11/2022 who is being seen 12/27/2022 for the evaluation of elevated troponin at the request of Dr. Roger Shelter.  History of Present Illness:   Brett Marquez is followed by [facility] cardiology for the above cardiac issues. He has a long complicated cardiac history. He reports dextro-transposition of the great arteries s/p Mustard operation at 6 months. As a teenager he underwent failed aflutter catheter ablation complicated by secondary AV block requiring DDD pacemaker implantation. Systemic function dropped as low as 23%. Scarring possibly led to VT and he underwent subsequent BiV ICD upgrade. He is on sotalol for VT. He has had pocket infection of the AICD in the past.  Most recent Echo 11/30/22 showed: Normal IVC connection to right atrium.  Normal RV size.  Atria difficult to assess due to atrial baffle's.  Superior limb of the systemic venous baffle is patent with low velocity laminar flow, pacer lead is visualized within superior limb of systemic venous baffle and terminating in the LV.  The inferior limb of the systemic venous baffle appears patent with low velocity laminar flow.  Both tricuspid and mitral valves appear normal without stenosis, trivial MR, mild TR.  Severely enlarged right ventricle.  Left ventricle is the pulmonary ventricle, the left ventricle is small with normal systolic function.  The aortic valve arises anteriorly from the systemic right ventricle, is trileaflet with  trivial regurgitation and no stenosis.  The pulmonary valve arises posteriorly from the left ventricle is mild regurgitation and no stenosis.  More recently the patient underwent Left MDT generator changeout 12/24/22 at [facility]. He was not kept overnight. Post-procedure he had fevers and went to the ER, but work-up was normal.  Patient presented to the [facility] on 12/26/2022 due to coughing up blood.  Patient reported he started coughing earlier that morning and 2 hours later he started coughing up blood.  He had 3-4 episodes of coughing up blood at home. He denies chest pain, LLE, orthopnea or pnd. No significant SOB. He is not on blood thinners.  In the ER patient had 1 more episode of hemoptysis in the ER.  Blood pressure 139/67, pulse 79 bpm, respiratory rate 28.  92% O2.  Labs showed hemoglobin 12.6, platelets 113 K, sodium 134.  High-sensitivity troponin 22, 22.  Chest x-ray showed new area of peripheral consolidation within the right midlung consistent with hemorrhage or infection.  CTA of the chest showed multifocal pneumonia and suspicion for alveolar hemorrhage.  Patient was started on antibiotics and admitted for further workup.  Past Medical History:  Diagnosis Date   Cardiac defibrillator in place    Heart failure [facility])    Hypertension    Transposition great arteries     Past Surgical History:  Procedure Laterality Date   CARDIAC DEFIBRILLATOR PLACEMENT     CHOLECYSTECTOMY       Home Medications:  Prior to Admission medications   Medication Sig Start Date End Date Taking? Authorizing Provider  acetaminophen (TYLENOL)  500 MG tablet Take 1,000 mg by mouth 2 (two) times daily as needed for moderate pain.   Yes [provider]  carvedilol (COREG) 25 MG tablet Take 25 mg by mouth 2 (two) times daily. 02/23/22  Yes [provider]  ENTRESTO 97-103 MG TAKE (1) TABLET BY MOUTH TWICE DAILY. Patient taking differently: Take 1 tablet by mouth 2 (two) times daily.  12/22/22  Yes Cook, Jayce G, DO  eplerenone (INSPRA) 25 MG tablet Take 1 tablet (25 mg total) by mouth daily. 10/26/22  Yes Cook, Jayce G, DO  furosemide (LASIX) 20 MG tablet Take 20 mg by mouth daily as needed for fluid.   Yes [provider]  omeprazole (PRILOSEC) 20 MG capsule Take 20 mg by mouth daily. 07/31/14  Yes [provider]  sotalol (BETAPACE) 160 MG tablet Take 160 mg by mouth 2 (two) times daily. 02/10/22  Yes [provider]    Inpatient Medications: Scheduled Meds:  carvedilol  25 mg Oral BID   pantoprazole  40 mg Oral Daily   sacubitril-valsartan  1 tablet Oral BID   sotalol  160 mg Oral BID   spironolactone  25 mg Oral Daily   Continuous Infusions:  cefTRIAXone (ROCEPHIN)  IV     doxycycline (VIBRAMYCIN) IV     PRN Meds: acetaminophen **OR** acetaminophen, polyethylene glycol  Allergies:    Allergies  Allergen Reactions   Norco [Hydrocodone-Acetaminophen] Nausea And Vomiting    Social History:   Social History   Socioeconomic History   Marital status: Married    Spouse name: Not on file   Number of children: Not on file   Years of education: Not on file   Highest education level: Not on file  Occupational History   Not on file  Tobacco Use   Smoking status: Never   Smokeless tobacco: Former  Scientific laboratory technician Use: Never used  Substance and Sexual Activity   Alcohol use: No   Drug use: No   Sexual activity: Yes  Other Topics Concern   Not on file  Social History Narrative   Not on file   Social Determinants of Health   Financial Resource Strain: Low Risk  (11/12/2022)   Overall Financial Resource Strain (CARDIA)    Difficulty of Paying Living Expenses: Not hard at all  Food Insecurity: No Food Insecurity (12/26/2022)   Hunger Vital Sign    Worried About Running Out of Food in the Last Year: Never true    [place] in the Last Year: Never true  Transportation Needs: No Transportation Needs (12/26/2022)    PRAPARE - Hydrologist (Medical): No    Lack of Transportation (Non-Medical): No  Physical Activity: Inactive (11/12/2022)   Exercise Vital Sign    Days of Exercise per Week: 0 days    Minutes of Exercise per Session: 0 min  Stress: No Stress Concern Present (11/12/2022)   [place]    Feeling of Stress : Not at all  Social Connections: Moderately Integrated (11/12/2022)   Social Connection and Isolation Panel [NHANES]    Frequency of Communication with Friends and Family: More than three times a week    Frequency of Social Gatherings with Friends and Family: Three times a week    Attends Religious Services: 1 to 4 times per year    Active Member of Clubs or Organizations: No    Attends Archivist  Meetings: Never    Marital Status: Married  Human resources officer Violence: Not At Risk (12/26/2022)   Humiliation, Afraid, Rape, and Kick questionnaire    Fear of Current or Ex-Partner: No    Emotionally Abused: No    Physically Abused: No    Sexually Abused: No    Family History:    Family History  Problem Relation Age of Onset   Healthy Mother    Healthy Father      ROS:  Please see the history of present illness.   All other ROS reviewed and negative.     Physical Exam/Data:   Vitals:   12/26/22 2026 12/26/22 2028 12/27/22 0026 12/27/22 0430  BP: (!) 144/75 (!) 144/75 123/76 137/77  Pulse: 70 70 69 70  Resp: (!) 22 (!) 22 20 20   Temp: 98.7 F (37.1 C) 98.7 F (37.1 C) 99.1 F (37.3 C) 97.8 F (36.6 C)  TempSrc: Oral Oral Oral Oral  SpO2: 94%  92% 99%  Weight:  130.8 kg    Height:  6\' 1"  (1.854 m)      Intake/Output Summary (Last 24 hours) at 12/27/2022 0932 Last data filed at 12/27/2022 0400 Gross per 24 hour  Intake 100 ml  Output --  Net 100 ml      12/26/2022    8:28 PM 12/25/2022    1:33 AM 11/12/2022    2:54 PM  Last 3 Weights  Weight (lbs) 288 lb 5.8 oz 284  lb 6.3 oz 284 lb  Weight (kg) 130.8 kg 129 kg 128.822 kg     Body mass index is 38.04 kg/m.  General:  Well nourished, well developed, in no acute distress HEENT: normal Neck: no JVD Vascular: No carotid bruits; Distal pulses 2+ bilaterally Cardiac:  normal S1, S2; RRR; no murmur  Lungs:  clear to auscultation bilaterally, no wheezing, rhonchi or rales  Abd: soft, nontender, no hepatomegaly  Ext: no edema Musculoskeletal:  No deformities, BUE and BLE strength normal and equal Skin: warm and dry  Neuro:  CNs 2-12 intact, no focal abnormalities noted Psych:  Normal affect   EKG:  The EKG was personally reviewed and demonstrates:  AV paced 70bpm Telemetry:  Telemetry was personally reviewed and demonstrates:  AV paced, 6 beats NSVT  Relevant CV Studies:  Echo 11/30/22 VEINS   Normal IVC connection to Right Atrium. The IVC is normal in size(1.5cm)    and collapsibility.     ATRIA    The Atrial size is difficult to assess due to atrial baffles. The Superior    Limb of the Systemic Venous Baffle is patent with low velocity laminar    flow, Pacer lead is visualized within Superior Limb of Systemic Venous    baffle and terminating in the LV. The Inferior Limb of the Systemic venous    baffle appears patent with low velocity laminar flow.     ATRIOVENTRICULAR VALVES    Both Tricuspid and Mitral valves appear morphologically normal without    stenosis. Trivial Mitral regurgitation. Mild Tricuspid Regurgitation.     VENTRICLES    The Right Ventricle is the Systemic Ventricle. The Systemic Right    Ventricle is Severely enlarged with Severe Systolic Dysfunction. The Left    Ventricle is the Pulmonary Ventricle, the Left Ventricle is small with    Normal Systolic Function.     SEMILUNAR VALVES    The Aortic Valve Arises Anteriorly from the Systemic Right Ventricle, is    Trileaflet  with Trivial regurgitation  and no stenosis. The Pulmonary    Valve Arises Posteriorly from the Left  Ventricle (Pulmonary Ventricle) and    has Mild regurgitation and no stenosis.      GREAT ARTERIES    The Ascending Aorta is poorly visualized on this exam. The Main Pulmonary    Artery appears Normal in size.   Echo 11/2021 VEINS:   NORMAL IVC SIZE AND INSPIRATORY COLLAPSE. SVC NOT WELL SEEN ON THIS    EXAM.        ATRIA & BAFFLES:    SUPERIOR LIMB OF BAFFLE APPEARS PATENT.  PACER LEAD SEEN IN LV (VENOUS    VENTRICLE) AND WITHIN SUPERIOR LIMB OF BAFFLE.    INFERIOR LIMB OF BAFFLE APPEARS PATENT.        AV VALVES:    TRIVIAL MITRAL (VENOUS) REGURGITATION.    MILD TRICUSPID (SYSTEMIC) REGURGITATION.        VENTRICLES:    DILATED SYSTEMIC VENTRICLE (RV) WITH SEVERELY REDUCED SYSTOLIC FUNCTION.    SMALL VENOUS VENTRICLE (LV) WITH NORMAL SYSTOLIC FUNCTION.        SEMI-LUNAR VALVES:    NO AORTIC REGURGITATION.    MILD PULMONARY REGURGITATION.        GREAT VESSELS:    NORMAL SIZED, LEFT SIDED AORTIC ARCH WITHOUT EVIDENCE OF COARCTATION.    NORMAL PA.  PA BIFURCATION NOT WELL SEEN.        NO MASS OR EFFUSION SEEN.     COMPARED WITH PRIOR ON 12/08/2018, NO SIGNIFICANT CHANGE.   Laboratory Data:  High Sensitivity Troponin:   Recent Labs  Lab 12/26/22 1456 12/26/22 1703  TROPONINIHS 22* 22*     Chemistry Recent Labs  Lab 12/25/22 0157 12/26/22 1456 12/27/22 0447  NA 136 134* 137  K 3.2* 3.7 3.2*  CL 104 102 103  CO2 24 24 23   GLUCOSE 156* 123* 96  BUN 12 9 9   CREATININE 0.94 0.85 0.82  CALCIUM 8.2* 8.6* 8.4*  GFRNONAA >60 >60 >60  ANIONGAP 8 8 11     Recent Labs  Lab 12/25/22 0157 12/26/22 1456  PROT 6.7 7.4  ALBUMIN 3.6 3.8  AST 39 34  ALT 41 35  ALKPHOS 52 54  BILITOT 1.0 1.2   Lipids No results for input(s): "CHOL", "TRIG", "HDL", "LABVLDL", "LDLCALC", "CHOLHDL" in the last 168 hours.  Hematology Recent Labs  Lab 12/25/22 0157 12/26/22 1456 12/26/22 2143 12/27/22 0447  WBC 4.0 7.2  --  5.7  RBC 4.08* 4.31  --  3.98*  HGB 11.8* 12.6* 11.7*  11.6*  HCT 34.8* 36.6* 33.8* 34.2*  MCV 85.3 84.9  --  85.9  MCH 28.9 29.2  --  29.1  MCHC 33.9 34.4  --  33.9  RDW 13.8 13.6  --  13.9  PLT 98* 113*  --  99*   Thyroid No results for input(s): "TSH", "FREET4" in the last 168 hours.  BNPNo results for input(s): "BNP", "PROBNP" in the last 168 hours.  DDimer No results for input(s): "DDIMER" in the last 168 hours.   Radiology/Studies:  CT Angio Chest PE W/Cm &/Or Wo Cm  Result Date: 12/26/2022 CLINICAL DATA:  Blood-tinged cough/emesis EXAM: CT ANGIOGRAPHY CHEST WITH CONTRAST TECHNIQUE: Multidetector CT imaging of the chest was performed using the standard protocol during bolus administration of intravenous contrast. Multiplanar CT image reconstructions and MIPs were obtained to evaluate the vascular anatomy. RADIATION DOSE REDUCTION: This exam was performed according to the departmental dose-optimization program which includes automated exposure control, adjustment of the mA  and/or kV according to patient size and/or use of iterative reconstruction technique. CONTRAST:  134mL OMNIPAQUE IOHEXOL 350 MG/ML SOLN COMPARISON:  CT chest dated 09/09/2014, chest radiograph dated 12/26/2022 FINDINGS: Cardiovascular: The study is adequate quality for the evaluation of pulmonary embolism the level of the proximal segmental arteries. There are no filling defects in the central, lobar, or segmental pulmonary artery branches to suggest acute pulmonary embolism. Transposition of the great arteries status post Mustard procedure. No significant pericardial fluid/thickening. Left chest wall ICD leads terminate in the morphologic left atrium and ventricle. Mediastinum/Nodes: Imaged thyroid gland without nodules meeting criteria for imaging follow-up by size. Normal esophagus. Scattered prominent subcentimeter lymph nodes with 1.0 cm subcarinal lymph node (5:51). Lungs/Pleura: The central airways are patent. Consolidative opacities and ground-glass nodules in the right  upper and bilateral lower lobes. No pneumothorax. No pleural effusion. Upper abdomen: Cholecystectomy. Enlarged spleen in the AP dimension measures 16.0 cm, unchanged Musculoskeletal: No acute or abnormal lytic or blastic osseous lesions. Median sternotomy wires are nondisplaced. Review of the MIP images confirms the above findings. IMPRESSION: 1. Consolidative opacities and ground-glass nodules in the right upper and bilateral lower lobes, suspicious for multifocal pneumonia. Superimposed alveolar hemorrhage can have a similar appearance. 2. No evidence of pulmonary embolism. 3. Stable splenomegaly. 4. Transposition of the great arteries status post Mustard procedure. Left chest wall ICD leads terminate in the morphologic left atrium and ventricle. Electronically Signed   By: Darrin Nipper M.D.   On: 12/26/2022 17:43   DG Chest Port 1 View  Result Date: 12/26/2022 CLINICAL DATA:  Cough, vomiting, short of breath EXAM: PORTABLE CHEST 1 VIEW COMPARISON:  12/25/2022 FINDINGS: Single frontal view of the chest demonstrates multi lead pacer/AICD unchanged. The cardiac silhouette remains enlarged. Peripheral rounded airspace disease within the right midlung zone. No effusion or pneumothorax. No acute bony abnormalities. IMPRESSION: 1. New area of peripheral consolidation within the right mid lung zone, consistent with hemorrhage or infection. 2. Stable enlarged cardiac silhouette. Electronically Signed   By: Randa Ngo M.D.   On: 12/26/2022 14:43   DG Chest Port 1 View  Result Date: 12/25/2022 CLINICAL DATA:  Possible sepsis EXAM: PORTABLE CHEST 1 VIEW COMPARISON:  06/27/2017 FINDINGS: Defibrillator is again seen. Cardiac shadow is enlarged. The lungs are well aerated bilaterally. No focal infiltrate or effusion is seen. No bony abnormality is noted. IMPRESSION: No active disease. Electronically Signed   By: Inez Catalina M.D.   On: 12/25/2022 02:27     Assessment and Plan:   PNA/Hemoptysis - presented with  coughing and hemoptysis found to have PNA -Chest CTA showed pneumonia with hemorrhage  -Patient is not on blood thinner therapy - pulmonology consulted and recommended admission to [facility] - Hgb stable, continue to monitor. 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 - most recent echo 11/29/32 showed severe RV dysfunction, severely enlarged RV and normal LVSF - PTA Coreg 35mg  BID, Entresto 97-103mg  BID, eplerenone 25mg  daily, lasix 20mg  daily - patient appears euvolemic on exam - restart home meds  VT s/p AICD - recent generator change out 3/29 at [facility] - continue sotalol - keep K>4  HTN - PTA Coreg and Entresto  Elevated troponin - HS trop 22>122 - no chest pain reported - no h/o MI or stenting -  no reported prior stress testing or LHC - elevated troponin in the setting of PNA, suspect mostly supply demand mismatch - echo ordered - can likely do noninvasive stress testing as outpatient with Rob Hickman - MD to see  For questions or updates, please contact [place] Please consult [email or web address] for contact info under    Signed, Cadence Arlyss Repress  12/27/2022 9:32 AM   Attending attestation  Patient seen and independently examined with Cadence Furth, PA-C. We discussed all aspects of the encounter. I agree with the assessment and plan as stated above.  Patient is a 42 year old M known to have d-TGA with a severe systemic ventricular dysfunction s/p atrial switch/mustard at 50 months old, attempted atypical atrial flutter ablation c/w second-degree AV block s/p PPM in 1996, VT s/p CRT-D with a GEN change recently on 12/24/2022 at [facility] is currently admitted to the hospitalist team for the management of hemoptysis secondary to alveolar hemorrhage and pneumonia.   Cardiologist consulted for clinical question, if carvedilol and sotalol can be taken together. Patient examination is remarkable for, patient not in acute respite distress, HEENT normal, JVD normal, S1-S2 heard, no S3, lungs clear, nontender and nondistended abdomen, no pitting edema in lower extremities, AAOx3.  Carvedilol and sotalol can be taken together and has been on this regimen since 2011 due to history of VT and severe systemic ventricular dysfunction.  Continue GDMT with Lasix 20 mg once daily, carvedilol 25 mg twice daily, Entresto 97-103 mg twice daily, eplerenone 25 mg once daily for severe systemic ventricle dysfunction.  Echocardiogram was performed on 11/30/2022 at [facility] that showed severe enlargement and dysfunction of the systemic right ventricle and normal LV, atrial size was difficult to assess due to atrial baffle's, the superior limb of the systemic venous baffle is patent with low velocity laminar flow, previously was visualized within the superior limb of systemic venous baffle and terminating in the LV, the inferior limb of the systemic venous baffle appears patent with low velocity laminar flow. Unremarkable echo and unchanged since prior echo.  Management of alveolar hemorrhage and pneumonia per primary team.  I have spent a total of 50 minutes with patient reviewing chart , telemetry, EKGs, labs and examining patient as well as establishing an assessment and plan that was discussed with the patient.  > 50% of time was spent in direct patient care.    Symphani Eckstrom Fidel Levy, MD [place]  12:42 PM

## 2022-12-27 NOTE — Hospital Course (Signed)
Brett Marquez is a 42 y.o. 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 Patient presented to the ED with complaints of coughing.  Reports at about 8 AM this morning he started coughing, and was coughing for about 2 hours continuously.  Then he started coughing up blood.  He has had about 3-4 episodes of coughing up blood since onset.  The last time he coughed here in the ED there was no blood.  Amount has mostly been nickel-sized, but he had 1 episode where it was more. He is not on blood thinners or antiplatelets.  No chest pain.  No difficulty breathing.  Patient had replacement of his ICD this past Friday 3/29 at [facility].  Later that day after the procedure he had a fever, spouse at bedside said temperature was up to 101.  Spouse gave him Tylenol before he came to the ED.  Blood work vitals and workup checked out so he was discharged.   Patient does not know if he had a tube down his throat for the procedure, but he reports on waking up his throat was very sore.  He has not had worsening pain or discoloration from his ICD pocket.   ED Course: Temperature 98.3.  Heart rate 70s to 80s.  Respiratory 20-36.  Blood pressure systolic [id number] to [id number].  O2 sats 91 to 97% on room air. WBC 7.2.  Troponin 22 > 122.   Chest x-ray-new area of peripheral consolidation within the right midlung, consistent with hemorrhage or infection, subsequent CTA chest-suggests multifocal pneumonia, but superimposed alveolar hemorrhage can have similar appearance. EDP Dr. Tacy Learn, pulmonologist on-call at [facility], felt this was more likely pneumonia, less likely alveolar hemorrhage, admit for observation, if hemoptysis gets worse likely hemorrhage.  Okay to admit here at [facility].

## 2022-12-27 NOTE — Consult Note (Signed)
NAME:  Brett Marquez, MRN:  [id number], DOB:  27-Feb-1981, LOS: 0 ADMISSION DATE:  12/26/2022, CONSULTATION DATE:  12/27/2022 REFERRING MD:  Dr. Roger Shelter, [facility], CHIEF COMPLAINT:  Hemoptysis   History of Present Illness:  42 yo male with hx of cardiac arrhythmia s/p AICD and followed at [facility] had LEFT MDT CRTD generator change on 12/24/22.  He did not have any respiratory symptoms prior to this.  He was discharged home after procedure.  That evening he started having chills.  He had temperature 100.9 F.  He presented to [facility] ER on 12/25/22.  CXR was clear at that time and he was d/c home.  He came back to ER on 12/26/22 after developed a productive cough and cough up phlegm with blood in it.  Chest xray and CT chest from 12/26/22 showed a new infiltrate in the right upper, and bilateral lower infiltrates.  He was started on antibiotics.  He reports having a history of reflux, and didn't take his reflux medication prior to his procedure at [facility].    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  No history of lung disease before.  He has soreness in his lower chest from coughing.  No further episode of fever since he has been in hospital.  No sick exposures.  He coughed up small plug this morning with some blood mixed in, but none since then.  He was able to walk in the hall several times without needing supplemental oxygen.  He denies nose bleeding.     Objective   Blood pressure 137/77, pulse 70, temperature 97.8 F (36.6 C), temperature source Oral, resp. rate 20, height 6\' 1"  (1.854 m), weight 130.8 kg, SpO2 99 %.        Intake/Output Summary (Last 24 hours)  at 12/27/2022 1153 Last data filed at 12/27/2022 0400 Gross per 24 hour  Intake 100 ml  Output --  Net 100 ml   Filed Weights   12/26/22 2028  Weight: 130.8 kg    Examination:  General - alert Eyes - pupils reactive ENT - no sinus tenderness, no stridor Cardiac - regular rate/rhythm, no murmur Chest - equal breath sounds b/l, no wheezing or rales Abdomen - soft, non tender, + bowel sounds Extremities - no cyanosis, clubbing, or edema Skin - no rashes Neuro - normal strength, moves extremities, follows commands Psych - normal mood and behavior   Discussion:  I suspect he developed aspiration pneumonitis after have procedure at [facility] while under sedation in the setting of GERD, and this caused him to have mild hemoptysis.  Clinic suspicion for alveolar hemorrhage is low, and don't think he needs serology or bronchoscopy at this time.  Assessment & Plan:   Hemoptysis likely from aspiration pneumonitis. - continue ABx for total of 7 days - f/u CXR 4/02 and if stable to improved, then he should be okay for discharge home with outpatient follow up in 4 to 6 weeks  Updated his wife at bedside  Labs   CBC: Recent Labs  Lab 12/25/22 0157 12/26/22 1456 12/26/22 2143 12/27/22 0447  WBC 4.0 7.2  --  5.7  NEUTROABS 2.7 5.9  --   --   HGB 11.8*  12.6* 11.7* 11.6*  HCT 34.8* 36.6* 33.8* 34.2*  MCV 85.3 84.9  --  85.9  PLT 98* 113*  --  99*    Basic Metabolic Panel: Recent Labs  Lab 12/25/22 0157 12/26/22 1456 12/27/22 0447  NA 136 134* 137  K 3.2* 3.7 3.2*  CL 104 102 103  CO2 24 24 23   GLUCOSE 156* 123* 96  BUN 12 9 9   CREATININE 0.94 0.85 0.82  CALCIUM 8.2* 8.6* 8.4*   GFR: Estimated Creatinine Clearance: 164.8 mL/min (by C-G formula based on SCr of 0.82 mg/dL). Recent Labs  Lab 12/25/22 0157 12/26/22 1456 12/27/22 0447  WBC 4.0 7.2 5.7  LATICACIDVEN 1.5  --   --     Liver Function Tests: Recent Labs  Lab 12/25/22 0157 12/26/22 1456  AST 39 34  ALT 41  35  ALKPHOS 52 54  BILITOT 1.0 1.2  PROT 6.7 7.4  ALBUMIN 3.6 3.8   No results for input(s): "LIPASE", "AMYLASE" in the last 168 hours. No results for input(s): "AMMONIA" in the last 168 hours.  ABG No results found for: "PHART", "PCO2ART", "PO2ART", "HCO3", "TCO2", "ACIDBASEDEF", "O2SAT"   Coagulation Profile: Recent Labs  Lab 12/25/22 0157  INR 1.1    Cardiac Enzymes: No results for input(s): "CKTOTAL", "CKMB", "CKMBINDEX", "TROPONINI" in the last 168 hours.  HbA1C: Hgb A1c MFr Bld  Date/Time Value Ref Range Status  08/03/2022 10:35 AM 6.3 (H) 4.8 - 5.6 % Final    Comment:             Prediabetes: 5.7 - 6.4          Diabetes: >6.4          Glycemic control for adults with diabetes: <7.0   03/19/2022 11:33 AM 6.1 (H) 4.8 - 5.6 % Final    Comment:             Prediabetes: 5.7 - 6.4          Diabetes: >6.4          Glycemic control for adults with diabetes: <7.0     CBG: No results for input(s): "GLUCAP" in the last 168 hours.  Review of Systems:   Reviewed and negative  Past Medical History:  He,  has a past medical history of Cardiac defibrillator in place, Heart failure ([place]), Hypertension, and Transposition great arteries.   Surgical History:   Past Surgical History:  Procedure Laterality Date   CARDIAC DEFIBRILLATOR PLACEMENT     CHOLECYSTECTOMY       Social History:   reports that he has never smoked. He has quit using smokeless tobacco. He reports that he does not drink alcohol and does not use drugs.   Family History:  His family history includes Healthy in his father and mother.   Allergies Allergies  Allergen Reactions   Norco [Hydrocodone-Acetaminophen] Nausea And Vomiting     Home Medications  Prior to Admission medications   Medication Sig Start Date End Date Taking? Authorizing Provider  acetaminophen (TYLENOL) 500 MG tablet Take 1,000 mg by mouth 2 (two) times daily as needed for moderate pain.   Yes [provider]   carvedilol (COREG) 25 MG tablet Take 25 mg by mouth 2 (two) times daily. 02/23/22  Yes [provider]  ENTRESTO 97-103 MG TAKE (1) TABLET BY MOUTH TWICE DAILY. Patient taking differently: Take 1 tablet by mouth 2 (two) times daily. 12/22/22  Yes Cook, Jayce G, DO  eplerenone (INSPRA) 25 MG tablet Take  1 tablet (25 mg total) by mouth daily. 10/26/22  Yes Cook, Jayce G, DO  furosemide (LASIX) 20 MG tablet Take 20 mg by mouth daily as needed for fluid.   Yes [provider]  omeprazole (PRILOSEC) 20 MG capsule Take 20 mg by mouth daily. 07/31/14  Yes [provider]  sotalol (BETAPACE) 160 MG tablet Take 160 mg by mouth 2 (two) times daily. 02/10/22  Yes [provider]     Signature:  Chesley Mires, MD [place] Pager - [phone] or [phone] 12/27/2022, 1:16 PM

## 2022-12-28 ENCOUNTER — Observation Stay (HOSPITAL_COMMUNITY): Payer: Medicare Other

## 2022-12-28 DIAGNOSIS — I441 Atrioventricular block, second degree: Secondary | ICD-10-CM | POA: Diagnosis present

## 2022-12-28 DIAGNOSIS — I5022 Chronic systolic (congestive) heart failure: Secondary | ICD-10-CM | POA: Diagnosis present

## 2022-12-28 DIAGNOSIS — J690 Pneumonitis due to inhalation of food and vomit: Secondary | ICD-10-CM | POA: Diagnosis present

## 2022-12-28 DIAGNOSIS — R042 Hemoptysis: Secondary | ICD-10-CM | POA: Diagnosis present

## 2022-12-28 DIAGNOSIS — Z881 Allergy status to other antibiotic agents status: Secondary | ICD-10-CM | POA: Diagnosis not present

## 2022-12-28 DIAGNOSIS — J189 Pneumonia, unspecified organism: Secondary | ICD-10-CM | POA: Diagnosis present

## 2022-12-28 DIAGNOSIS — Z1152 Encounter for screening for COVID-19: Secondary | ICD-10-CM | POA: Diagnosis not present

## 2022-12-28 DIAGNOSIS — Z87891 Personal history of nicotine dependence: Secondary | ICD-10-CM | POA: Diagnosis not present

## 2022-12-28 DIAGNOSIS — Q203 Discordant ventriculoarterial connection: Secondary | ICD-10-CM | POA: Diagnosis not present

## 2022-12-28 DIAGNOSIS — K920 Hematemesis: Secondary | ICD-10-CM | POA: Diagnosis present

## 2022-12-28 DIAGNOSIS — E669 Obesity, unspecified: Secondary | ICD-10-CM | POA: Diagnosis present

## 2022-12-28 DIAGNOSIS — Z6838 Body mass index (BMI) 38.0-38.9, adult: Secondary | ICD-10-CM | POA: Diagnosis not present

## 2022-12-28 DIAGNOSIS — I110 Hypertensive heart disease with heart failure: Secondary | ICD-10-CM | POA: Diagnosis present

## 2022-12-28 DIAGNOSIS — I4892 Unspecified atrial flutter: Secondary | ICD-10-CM | POA: Diagnosis present

## 2022-12-28 DIAGNOSIS — I472 Ventricular tachycardia, unspecified: Secondary | ICD-10-CM | POA: Diagnosis present

## 2022-12-28 DIAGNOSIS — K219 Gastro-esophageal reflux disease without esophagitis: Secondary | ICD-10-CM | POA: Diagnosis present

## 2022-12-28 DIAGNOSIS — Z79899 Other long term (current) drug therapy: Secondary | ICD-10-CM | POA: Diagnosis not present

## 2022-12-28 DIAGNOSIS — Z95810 Presence of automatic (implantable) cardiac defibrillator: Secondary | ICD-10-CM | POA: Diagnosis not present

## 2022-12-28 DIAGNOSIS — Z886 Allergy status to analgesic agent status: Secondary | ICD-10-CM | POA: Diagnosis not present

## 2022-12-28 LAB — CBC
HCT: 33.1 % — ABNORMAL LOW (ref 39.0–52.0)
Hemoglobin: 11.3 g/dL — ABNORMAL LOW (ref 13.0–17.0)
MCH: 29.3 pg (ref 26.0–34.0)
MCHC: 34.1 g/dL (ref 30.0–36.0)
MCV: 85.8 fL (ref 80.0–100.0)
Platelets: 95 10*3/uL — ABNORMAL LOW (ref 150–400)
RBC: 3.86 MIL/uL — ABNORMAL LOW (ref 4.22–5.81)
RDW: 13.5 % (ref 11.5–15.5)
WBC: 3.2 10*3/uL — ABNORMAL LOW (ref 4.0–10.5)
nRBC: 0 % (ref 0.0–0.2)

## 2022-12-28 LAB — BASIC METABOLIC PANEL
Anion gap: 8 (ref 5–15)
BUN: 12 mg/dL (ref 6–20)
CO2: 25 mmol/L (ref 22–32)
Calcium: 8.3 mg/dL — ABNORMAL LOW (ref 8.9–10.3)
Chloride: 105 mmol/L (ref 98–111)
Creatinine, Ser: 0.84 mg/dL (ref 0.61–1.24)
GFR, Estimated: >60 mL/min (ref >60)
Glucose, Bld: 106 mg/dL — ABNORMAL HIGH (ref 70–99)
Potassium: 3.3 mmol/L — ABNORMAL LOW (ref 3.5–5.1)
Sodium: 138 mmol/L (ref 135–145)

## 2022-12-28 LAB — MAGNESIUM: Magnesium: 1.4 mg/dL — ABNORMAL LOW (ref 1.7–2.4)

## 2022-12-28 MED ORDER — POTASSIUM CHLORIDE CRYS ER 20 MEQ PO TBCR
40.0000 meq | EXTENDED_RELEASE_TABLET | Freq: Once | ORAL | Status: AC
  Administered 2022-12-28: 40 meq via ORAL
  Filled 2022-12-28: qty 2

## 2022-12-28 MED ORDER — LEVOFLOXACIN 500 MG PO TABS
750.0000 mg | ORAL_TABLET | Freq: Every day | ORAL | Status: DC
  Administered 2022-12-28: 750 mg via ORAL
  Filled 2022-12-28: qty 2

## 2022-12-28 MED ORDER — LEVOFLOXACIN 750 MG PO TABS: 750.0000 mg | ORAL_TABLET | Freq: Every day | ORAL | 0 refills | Status: AC

## 2022-12-28 NOTE — Discharge Summary (Signed)
Physician Discharge Summary   Patient: Brett Marquez MRN: [id number] DOB: 1980-12-12  Admit date:     12/26/2022  Discharge date: 12/28/22  Discharge Physician: Deatra James   PCP: [name], DO   Recommendations at discharge:  -Follow-up with PCP in 1-2 weeks -Follow-up with cardiology in 1 week -Cardiac diet with potassium.  Discharge Diagnoses: Principal Problem:   PNA (pneumonia) Active Problems:   D-TGA (dextro-transposition of great arteries)   Congestive heart failure   AV block, 2nd degree   Biventricular implantable cardioverter-defibrillator in situ   Hypertension   Hemoptysis   Aspiration pneumonitis  Resolved Problems:   * No resolved hospital problems. *  Hospital Course: Brett Marquez is a 42 y.o. 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 Patient presented to the ED with complaints of coughing.  Reports at about 8 AM this morning he started coughing, and was coughing for about 2 hours continuously.  Then he started coughing up blood.  He has had about 3-4 episodes of coughing up blood since onset.  The last time he coughed here in the ED there was no blood.  Amount has mostly been nickel-sized, but he had 1 episode where it was more. He is not on blood thinners or antiplatelets.  No chest pain.  No difficulty breathing.  Patient had replacement of his ICD this past Friday 3/29 at [facility].  Later that day after the procedure he had a fever, spouse at bedside said temperature was up to 101.  Spouse gave him Tylenol before he came to the ED.  Blood work vitals and workup checked out so he was discharged.   Patient does not know if he had a tube down his throat for the procedure, but he reports on waking up his throat was very sore.  He has not had worsening pain or discoloration from his ICD pocket.   ED Course: Temperature 98.3.   Heart rate 70s to 80s.  Respiratory 20-36.  Blood pressure systolic [id number] to [id number].  O2 sats 91 to 97% on room air. WBC 7.2.  Troponin 22 > 122.   Chest x-ray-new area of peripheral consolidation within the right midlung, consistent with hemorrhage or infection, subsequent CTA chest-suggests multifocal pneumonia, but superimposed alveolar hemorrhage can have similar appearance. EDP Dr. Tacy Learn, pulmonologist on-call at [facility], felt this was more likely pneumonia, less likely alveolar hemorrhage, admit for observation, if hemoptysis gets worse likely hemorrhage.  Okay to admit here at [facility].  ===================================================================== * PNA (pneumonia) - Presented with hemoptysis.  (Sputum changes with blood) -Sputum production clear white with blood-tinged sputum >> has improved -Currently stable satting 99% on room air, WBC 5.7, lactic acid 1.5  -POA: Mild tachypnea, tachycardic, otherwise afebrile, normotensive, satting 97% on room air  Tachypneic, respiratory rate 16-36, otherwise stable vitals.  Sats 91 to 97% on room air.    Afebrile without leukocytosis.[name] out for sepsis.  Chest x-ray-new area of peripheral consolidation within the right midlung, consistent with hemorrhage or infection, subsequent CTA chest-suggests multifocal pneumonia, but superimposed alveolar hemorrhage can have similar appearance.  - EDP talked to pulmonologist Dr. Tacy Learn,- felt this was more likely pneumonia, less likely alveolar hemorrhage,  -Dr. Irene Pap was consulted, also agreed with conclusion that this is less likely alveolar hemorrhage  -He is not on any form of antiplatelet or anticoagulation -IV ceftriaxone and doxycycline (QTc  prolonged)--- had a reaction to doxycycline, subsequently discontinued.  IV Rocephin also discontinued  -Started on p.o. Levaquin for 5 more days   - ICD replaced 3/29 symptoms started later that day.  He was sedated for procedure, but does not appear  he was intubated.  Patient may have aspirated.  Hypertension Stable. -Resume carvedilol, Entresto  Biventricular implantable cardioverter-defibrillator in situ ICD replaced 3/29 at [facility].  Slight erythema present, but appropriate for recent procedure, no signs of infection.  Congestive heart failure Systolic CHF.   -Presently stable and compensated.  Not on diuretics.   Care Everywhere last TTE (11/30/22): systemic ventricle (RV) with severe enlargement and severe systolic dysfxn, pulmonary ventricle (LV) is small with normal fxn, PV (from LV/pulmonary ventricle) with mild regurgitation and no stenosis. -Resume Entresto, eplerenone,  -He is on both carvedilol and sotalol-I confirmed      Consultants: Cardiology/pulmonology Procedures performed: none   Disposition: Home Diet recommendation:  Discharge Diet Orders (From admission, onward)     Start     Ordered   12/28/22 0000  Diet - low sodium heart healthy        12/28/22 0800           Cardiac diet DISCHARGE MEDICATION: Allergies as of 12/28/2022       Reactions   Norco [hydrocodone-acetaminophen] Nausea And Vomiting   Doxycycline Other (See Comments)   Pt reports metal taste, increased coughing, burning/cool sensation around my IV site, feeling heart "out of rhythm", and spiked temp during IV doxy administration 12/27/22.        Medication List     TAKE these medications    acetaminophen 500 MG tablet Commonly known as: TYLENOL Take 1,000 mg by mouth 2 (two) times daily as needed for moderate pain.   carvedilol 25 MG tablet Commonly known as: COREG Take 25 mg by mouth 2 (two) times daily.   Entresto 97-103 MG Generic drug: sacubitril-valsartan TAKE (1) TABLET BY MOUTH TWICE DAILY. What changed: See the new instructions.   eplerenone 25 MG tablet Commonly known as: INSPRA Take 1 tablet (25 mg total) by mouth daily.   furosemide 20 MG tablet Commonly known as: LASIX Take 20 mg by mouth daily as  needed for fluid.   levofloxacin 750 MG tablet Commonly known as: LEVAQUIN Take 1 tablet (750 mg total) by mouth daily for 5 days.   omeprazole 20 MG capsule Commonly known as: PRILOSEC Take 20 mg by mouth daily.   sotalol 160 MG tablet Commonly known as: BETAPACE Take 160 mg by mouth 2 (two) times daily.        Discharge Exam: Filed Weights   12/26/22 2028  Weight: 130.8 kg        General:  AAO x 3,  cooperative, no distress;   HEENT:  Normocephalic, PERRL, otherwise with in Normal limits   Neuro:  CNII-XII intact. , normal motor and sensation, reflexes intact   Lungs:   Clear to auscultation BL, Respirations unlabored,  No wheezes / crackles  Cardio:    S1/S2, RRR, No murmure, No Rubs or Gallops   Abdomen:  Soft, non-tender, bowel sounds active all four quadrants, no guarding or peritoneal signs.  Muscular  skeletal:  Limited exam -global generalized weaknesses - in bed, able to move all 4 extremities,   2+ pulses,  symmetric, No pitting edema  Skin:  Dry, warm to touch, negative for any Rashes,  Wounds: Please see nursing documentation  Condition at discharge: good  The results of significant diagnostics from this hospitalization (including imaging, microbiology, ancillary and laboratory) are listed below for reference.   Imaging Studies: CT Angio Chest PE W/Cm &/Or Wo Cm  Result Date: 12/26/2022 CLINICAL DATA:  Blood-tinged cough/emesis EXAM: CT ANGIOGRAPHY CHEST WITH CONTRAST TECHNIQUE: Multidetector CT imaging of the chest was performed using the standard protocol during bolus administration of intravenous contrast. Multiplanar CT image reconstructions and MIPs were obtained to evaluate the vascular anatomy. RADIATION DOSE REDUCTION: This exam was performed according to the departmental dose-optimization program which includes automated exposure control, adjustment of the mA and/or kV according to patient size and/or use of iterative reconstruction  technique. CONTRAST:  131mL OMNIPAQUE IOHEXOL 350 MG/ML SOLN COMPARISON:  CT chest dated 09/09/2014, chest radiograph dated 12/26/2022 FINDINGS: Cardiovascular: The study is adequate quality for the evaluation of pulmonary embolism the level of the proximal segmental arteries. There are no filling defects in the central, lobar, or segmental pulmonary artery branches to suggest acute pulmonary embolism. Transposition of the great arteries status post Mustard procedure. No significant pericardial fluid/thickening. Left chest wall ICD leads terminate in the morphologic left atrium and ventricle. Mediastinum/Nodes: Imaged thyroid gland without nodules meeting criteria for imaging follow-up by size. Normal esophagus. Scattered prominent subcentimeter lymph nodes with 1.0 cm subcarinal lymph node (5:51). Lungs/Pleura: The central airways are patent. Consolidative opacities and ground-glass nodules in the right upper and bilateral lower lobes. No pneumothorax. No pleural effusion. Upper abdomen: Cholecystectomy. Enlarged spleen in the AP dimension measures 16.0 cm, unchanged Musculoskeletal: No acute or abnormal lytic or blastic osseous lesions. Median sternotomy wires are nondisplaced. Review of the MIP images confirms the above findings. IMPRESSION: 1. Consolidative opacities and ground-glass nodules in the right upper and bilateral lower lobes, suspicious for multifocal pneumonia. Superimposed alveolar hemorrhage can have a similar appearance. 2. No evidence of pulmonary embolism. 3. Stable splenomegaly. 4. Transposition of the great arteries status post Mustard procedure. Left chest wall ICD leads terminate in the morphologic left atrium and ventricle. Electronically Signed   By: Darrin Nipper M.D.   On: 12/26/2022 17:43   DG Chest Port 1 View  Result Date: 12/26/2022 CLINICAL DATA:  Cough, vomiting, short of breath EXAM: PORTABLE CHEST 1 VIEW COMPARISON:  12/25/2022 FINDINGS: Single frontal view of the chest  demonstrates multi lead pacer/AICD unchanged. The cardiac silhouette remains enlarged. Peripheral rounded airspace disease within the right midlung zone. No effusion or pneumothorax. No acute bony abnormalities. IMPRESSION: 1. New area of peripheral consolidation within the right mid lung zone, consistent with hemorrhage or infection. 2. Stable enlarged cardiac silhouette. Electronically Signed   By: Randa Ngo M.D.   On: 12/26/2022 14:43   DG Chest Port 1 View  Result Date: 12/25/2022 CLINICAL DATA:  Possible sepsis EXAM: PORTABLE CHEST 1 VIEW COMPARISON:  06/27/2017 FINDINGS: Defibrillator is again seen. Cardiac shadow is enlarged. The lungs are well aerated bilaterally. No focal infiltrate or effusion is seen. No bony abnormality is noted. IMPRESSION: No active disease. Electronically Signed   By: Inez Catalina M.D.   On: 12/25/2022 02:27    Microbiology: Results for orders placed or performed during the hospital encounter of 12/25/22  Blood Culture (routine x 2)     Status: None (Preliminary result)   Collection Time: 12/25/22  1:57 AM   Specimen: Left Antecubital; Blood  Result Value Ref Range Status   Specimen Description   Final    LEFT ANTECUBITAL BOTTLES DRAWN AEROBIC AND ANAEROBIC   Special  Requests Blood Culture adequate volume  Final   Culture   Final    NO GROWTH 2 DAYS Performed at [facility], [address], [place], [place] [address]    Report Status PENDING  Incomplete  Blood Culture (routine x 2)     Status: None (Preliminary result)   Collection Time: 12/25/22  2:30 AM   Specimen: BLOOD RIGHT HAND  Result Value Ref Range Status   Specimen Description BLOOD RIGHT HAND  Final   Special Requests   Final    BOTTLES DRAWN AEROBIC AND ANAEROBIC Blood Culture adequate volume   Culture   Final    NO GROWTH 2 DAYS Performed at [facility], [address] [place], [place] [address]    Report Status PENDING  Incomplete  SARS Coronavirus 2 by RT PCR (hospital order,  performed in [place] hospital lab) *cepheid single result test* Anterior Nasal Swab     Status: None   Collection Time: 12/25/22  3:05 AM   Specimen: Anterior Nasal Swab  Result Value Ref Range Status   SARS Coronavirus 2 by RT PCR NEGATIVE NEGATIVE Final    Comment: (NOTE) SARS-CoV-2 target nucleic acids are NOT DETECTED.  The SARS-CoV-2 RNA is generally detectable in upper and lower respiratory specimens during the acute phase of infection. The lowest concentration of SARS-CoV-2 viral copies this assay can detect is 250 copies / mL. A negative result does not preclude SARS-CoV-2 infection and should not be used as the sole basis for treatment or other patient management decisions.  A negative result may occur with improper specimen collection / handling, submission of specimen other than nasopharyngeal swab, presence of viral mutation(s) within the areas targeted by this assay, and inadequate number of viral copies (<250 copies / mL). A negative result must be combined with clinical observations, patient history, and epidemiological information.  Fact Sheet for Patients:   https://www.patel.info/  Fact Sheet for Healthcare Providers: https://hall.com/  This test is not yet approved or  cleared by the [place] FDA and has been authorized for detection and/or diagnosis of SARS-CoV-2 by FDA under an Emergency Use Authorization (EUA).  This EUA will remain in effect (meaning this test can be used) for the duration of the COVID-19 declaration under Section 564(b)(1) of the Act, 21 U.S.C. section 360bbb-3(b)(1), unless the authorization is terminated or revoked sooner.  Performed at [facility], [address], [place],  [address]     Labs: CBC: Recent Labs  Lab 12/25/22 0157 12/26/22 1456 12/26/22 2143 12/27/22 0447 12/28/22 0508  WBC 4.0 7.2  --  5.7 3.2*  NEUTROABS 2.7 5.9  --   --   --   HGB 11.8* 12.6* 11.7*  11.6* 11.3*  HCT 34.8* 36.6* 33.8* 34.2* 33.1*  MCV 85.3 84.9  --  85.9 85.8  PLT 98* 113*  --  99* 95*   Basic Metabolic Panel: Recent Labs  Lab 12/25/22 0157 12/26/22 1456 12/27/22 0447 12/28/22 0508  NA 136 134* 137 138  K 3.2* 3.7 3.2* 3.3*  CL 104 102 103 105  CO2 24 24 23 25   GLUCOSE 156* 123* 96 106*  BUN 12 9 9 12   CREATININE 0.94 0.85 0.82 0.84  CALCIUM 8.2* 8.6* 8.4* 8.3*   Liver Function Tests: Recent Labs  Lab 12/25/22 0157 12/26/22 1456  AST 39 34  ALT 41 35  ALKPHOS 52 54  BILITOT 1.0 1.2  PROT 6.7 7.4  ALBUMIN 3.6 3.8   CBG: No results for input(s): "  GLUCAP" in the last 168 hours.  Discharge time spent: greater than 30 minutes.  Signed: Deatra James, MD [facility] 12/28/2022

## 2022-12-28 NOTE — Progress Notes (Signed)
Ng Discharge Note  Admit Date:  12/26/2022 Discharge date: 12/28/2022   Brett Marquez to be D/C'd Home per MD order.  AVS completed. Patient/caregiver able to verbalize understanding.  Discharge Medication: Allergies as of 12/28/2022       Reactions   Norco [hydrocodone-acetaminophen] Nausea And Vomiting   Doxycycline Other (See Comments)   Pt reports metal taste, increased coughing, burning/cool sensation around my IV site, feeling heart "out of rhythm", and spiked temp during IV doxy administration 12/27/22.        Medication List     TAKE these medications    acetaminophen 500 MG tablet Commonly known as: TYLENOL Take 1,000 mg by mouth 2 (two) times daily as needed for moderate pain.   carvedilol 25 MG tablet Commonly known as: COREG Take 25 mg by mouth 2 (two) times daily.   Entresto 97-103 MG Generic drug: sacubitril-valsartan TAKE (1) TABLET BY MOUTH TWICE DAILY. What changed: See the new instructions.   eplerenone 25 MG tablet Commonly known as: INSPRA Take 1 tablet (25 mg total) by mouth daily.   furosemide 20 MG tablet Commonly known as: LASIX Take 20 mg by mouth daily as needed for fluid.   levofloxacin 750 MG tablet Commonly known as: LEVAQUIN Take 1 tablet (750 mg total) by mouth daily for 5 days.   omeprazole 20 MG capsule Commonly known as: PRILOSEC Take 20 mg by mouth daily.   sotalol 160 MG tablet Commonly known as: BETAPACE Take 160 mg by mouth 2 (two) times daily.        Discharge Assessment: Vitals:   12/27/22 2355 12/28/22 0430  BP: (!) 127/55 (!) 147/80  Pulse: 69 69  Resp: 18 20  Temp: 98.2 F (36.8 C) 98 F (36.7 C)  SpO2: 94% 95%   Skin clean, dry and intact without evidence of skin break down, no evidence of skin tears noted. IV catheter discontinued intact. Site without signs and symptoms of complications - no redness or edema noted at insertion site, patient denies c/o pain - only slight tenderness at site.  Dressing with  slight pressure applied.  D/c Instructions-Education: Discharge instructions given to patient/family with verbalized understanding. D/c education completed with patient/family including follow up instructions, medication list, d/c activities limitations if indicated, with other d/c instructions as indicated by MD - patient able to verbalize understanding, all questions fully answered. Patient instructed to return to ED, call 911, or call MD for any changes in condition.  Patient escorted via [place], and D/C home via private auto.  Tsosie Billing, LPN [id number] AM

## 2022-12-28 NOTE — Progress Notes (Signed)
Patient has order for Doxycycline IV due at 0300. After speaking with patient and day shift nurse, patient had a reaction to medication at 1830. Patient stated he does not want to take medication anymore. Dr Clearence Ped made aware. Also pharmacy was consulted and due to patients refusal order will be discontinued. No new orders obtained at this time.

## 2022-12-29 ENCOUNTER — Telehealth: Payer: Self-pay

## 2022-12-29 NOTE — Transitions of Care (Post Inpatient/ED Visit) (Signed)
   12/29/2022  Name: Brett Marquez MRN: [id number] DOB: 10/09/1980  Today's TOC FU Call Status: Today's TOC FU Call Status:: Successful TOC FU Call Competed TOC FU Call Complete Date: 12/29/22  Transition Care Management Follow-up Telephone Call Date of Discharge: 12/28/22 Discharge [facility] Type of Discharge: Inpatient Admission Primary Inpatient Discharge Diagnosis:: Pneumonia How have you been since you were released from the hospital?: Same (Patients wife noted he is continues to be tired and not feeling great.) Any questions or concerns?: No  Items Reviewed: Did you receive and understand the discharge instructions provided?: Yes Medications obtained and verified?: Yes (Medications Reviewed) Any new allergies since your discharge?: No Dietary orders reviewed?: No Do you have support at home?: Yes People in Home: spouse Name of Support/Comfort Primary Source: [facility] and Equipment/Supplies: [place] Ordered?: No Any new equipment or medical supplies ordered?: No  Functional Questionnaire: Do you need assistance with bathing/showering or dressing?: Yes (spouse assists patient with getting dressed, putting on shirt) Do you need assistance with meal preparation?: No Do you need assistance with eating?: No Do you have difficulty maintaining continence: No Do you need assistance with getting out of bed/getting out of a chair/moving?: No Do you have difficulty managing or taking your medications?: No  Follow up appointments reviewed: PCP Follow-up appointment confirmed?: Yes Date of PCP follow-up appointment?: 01/03/23 Follow-up Provider: Dr. Lacinda Axon [facility] Follow-up appointment confirmed?: Yes Date of Specialist follow-up appointment?: 03/15/23 Follow-Up Specialty Provider:: Dr. Glennon Mac, [place] Do you need transportation to your follow-up appointment?: No Do you understand care options if your condition(s)  worsen?: Yes-patient verbalized understanding (spouse)  Johnney Killian, RN, BSN, CCM Care Management Coordinator [facility] Phone: [phone]: [phone]

## 2022-12-30 LAB — CULTURE, BLOOD (ROUTINE X 2)
Culture: NO GROWTH
Culture: NO GROWTH
Special Requests: ADEQUATE
Special Requests: ADEQUATE

## 2023-01-03 ENCOUNTER — Ambulatory Visit (INDEPENDENT_AMBULATORY_CARE_PROVIDER_SITE_OTHER): Payer: Medicare Other | Admitting: Family Medicine

## 2023-01-03 VITALS — BP 131/84 | HR 75 | Temp 98.2°F | Ht 73.0 in | Wt 281.0 lb

## 2023-01-03 DIAGNOSIS — F4323 Adjustment disorder with mixed anxiety and depressed mood: Secondary | ICD-10-CM

## 2023-01-03 DIAGNOSIS — J189 Pneumonia, unspecified organism: Secondary | ICD-10-CM

## 2023-01-03 DIAGNOSIS — F4320 Adjustment disorder, unspecified: Secondary | ICD-10-CM | POA: Insufficient documentation

## 2023-01-03 MED ORDER — BUSPIRONE HCL 7.5 MG PO TABS: 7.5000 mg | ORAL_TABLET | Freq: Two times a day (BID) | ORAL | 0 refills | Status: DC | PRN

## 2023-01-03 NOTE — Assessment & Plan Note (Signed)
Patient states that is unsure if he will use any medication.  However, he wanted to have it on hand.  BuSpar as directed.

## 2023-01-03 NOTE — Assessment & Plan Note (Signed)
Resolved.  He is doing well he has finished his course of antibiotics.

## 2023-01-03 NOTE — Patient Instructions (Addendum)
Use the medication if you like.  Best of luck with your daughter.  Follow up in 3-6 months.
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
## 2023-01-25 ENCOUNTER — Ambulatory Visit: Payer: Medicare Other | Admitting: Family Medicine

## 2023-02-27 ENCOUNTER — Encounter: Payer: Self-pay | Admitting: Family Medicine

## 2023-02-28 ENCOUNTER — Other Ambulatory Visit: Payer: Self-pay | Admitting: Family Medicine

## 2023-02-28 MED ORDER — OMEPRAZOLE 20 MG PO CPDR: 20.0000 mg | DELAYED_RELEASE_CAPSULE | Freq: Every day | ORAL | 1 refills | Status: DC

## 2023-03-22 ENCOUNTER — Other Ambulatory Visit: Payer: Self-pay | Admitting: Family Medicine

## 2023-04-12 ENCOUNTER — Encounter: Payer: Self-pay | Admitting: Family Medicine

## 2023-04-12 ENCOUNTER — Ambulatory Visit (INDEPENDENT_AMBULATORY_CARE_PROVIDER_SITE_OTHER): Payer: Medicare Other | Admitting: Family Medicine

## 2023-04-12 ENCOUNTER — Other Ambulatory Visit: Payer: Self-pay | Admitting: Family Medicine

## 2023-04-12 ENCOUNTER — Telehealth: Payer: Self-pay

## 2023-04-12 DIAGNOSIS — E785 Hyperlipidemia, unspecified: Secondary | ICD-10-CM

## 2023-04-12 DIAGNOSIS — D649 Anemia, unspecified: Secondary | ICD-10-CM | POA: Diagnosis not present

## 2023-04-12 DIAGNOSIS — I10 Essential (primary) hypertension: Secondary | ICD-10-CM

## 2023-04-12 DIAGNOSIS — R7303 Prediabetes: Secondary | ICD-10-CM

## 2023-04-12 DIAGNOSIS — I5022 Chronic systolic (congestive) heart failure: Secondary | ICD-10-CM

## 2023-04-12 DIAGNOSIS — E1169 Type 2 diabetes mellitus with other specified complication: Secondary | ICD-10-CM

## 2023-04-12 DIAGNOSIS — Q203 Discordant ventriculoarterial connection: Secondary | ICD-10-CM | POA: Diagnosis not present

## 2023-04-12 DIAGNOSIS — E781 Pure hyperglyceridemia: Secondary | ICD-10-CM

## 2023-04-12 DIAGNOSIS — D696 Thrombocytopenia, unspecified: Secondary | ICD-10-CM

## 2023-04-12 NOTE — Patient Instructions (Signed)
Labs ordered.  I am referring you to [facility].  Follow up in 3 months.

## 2023-04-12 NOTE — Telephone Encounter (Signed)
Pt needs albumin test ordered went to hospital unable to pee and will come to lab tomorrow.Albumin test needs to be ordered.

## 2023-04-13 DIAGNOSIS — E119 Type 2 diabetes mellitus without complications: Secondary | ICD-10-CM | POA: Insufficient documentation

## 2023-04-13 DIAGNOSIS — D696 Thrombocytopenia, unspecified: Secondary | ICD-10-CM | POA: Insufficient documentation

## 2023-04-13 DIAGNOSIS — E785 Hyperlipidemia, unspecified: Secondary | ICD-10-CM | POA: Insufficient documentation

## 2023-04-13 LAB — CMP14+EGFR
ALT: 46 IU/L — ABNORMAL HIGH (ref 0–44)
AST: 45 IU/L — ABNORMAL HIGH (ref 0–40)
Albumin: 4.4 g/dL (ref 4.1–5.1)
Alkaline Phosphatase: 85 IU/L (ref 44–121)
BUN/Creatinine Ratio: 7 — ABNORMAL LOW (ref 9–20)
BUN: 7 mg/dL (ref 6–24)
Bilirubin Total: 0.8 mg/dL (ref 0.0–1.2)
CO2: 23 mmol/L (ref 20–29)
Calcium: 9.6 mg/dL (ref 8.7–10.2)
Chloride: 103 mmol/L (ref 96–106)
Creatinine, Ser: 0.95 mg/dL (ref 0.76–1.27)
Globulin, Total: 3.1 g/dL (ref 1.5–4.5)
Glucose: 123 mg/dL — ABNORMAL HIGH (ref 70–99)
Potassium: 4.2 mmol/L (ref 3.5–5.2)
Sodium: 142 mmol/L (ref 134–144)
Total Protein: 7.5 g/dL (ref 6.0–8.5)
eGFR: 102 mL/min/{1.73_m2} (ref >59)

## 2023-04-13 LAB — LIPID PANEL
Chol/HDL Ratio: 4.1 ratio (ref 0.0–5.0)
Cholesterol, Total: 106 mg/dL (ref 100–199)
HDL: 26 mg/dL — ABNORMAL LOW (ref >39)
LDL Chol Calc (NIH): 54 mg/dL (ref 0–99)
Triglycerides: 146 mg/dL (ref 0–149)
VLDL Cholesterol Cal: 26 mg/dL (ref 5–40)

## 2023-04-13 LAB — IRON,TIBC AND FERRITIN PANEL
Ferritin: 227 ng/mL (ref 30–400)
Iron Saturation: 22 % (ref 15–55)
Iron: 89 ug/dL (ref 38–169)
Total Iron Binding Capacity: 403 ug/dL (ref 250–450)
UIBC: 314 ug/dL (ref 111–343)

## 2023-04-13 LAB — CBC
Hematocrit: 43.7 % (ref 37.5–51.0)
Hemoglobin: 15 g/dL (ref 13.0–17.7)
MCH: 29 pg (ref 26.6–33.0)
MCHC: 34.3 g/dL (ref 31.5–35.7)
MCV: 84 fL (ref 79–97)
Platelets: 146 10*3/uL — ABNORMAL LOW (ref 150–450)
RBC: 5.18 x10E6/uL (ref 4.14–5.80)
RDW: 14 % (ref 11.6–15.4)
WBC: 4.8 10*3/uL (ref 3.4–10.8)

## 2023-04-13 LAB — HEMOGLOBIN A1C
Est. average glucose Bld gHb Est-mCnc: 146 mg/dL
Hgb A1c MFr Bld: 6.7 % — ABNORMAL HIGH (ref 4.8–5.6)

## 2023-04-13 MED ORDER — EMPAGLIFLOZIN 10 MG PO TABS: 10.0000 mg | ORAL_TABLET | Freq: Every day | ORAL | 3 refills | Status: DC

## 2023-04-13 NOTE — Assessment & Plan Note (Signed)
BP fairly well controlled.  Continue current medications.

## 2023-04-13 NOTE — Assessment & Plan Note (Signed)
Referring to [facility].

## 2023-04-13 NOTE — Telephone Encounter (Signed)
 Patient notified and verbalized understanding. 

## 2023-04-13 NOTE — Assessment & Plan Note (Signed)
Has severe systolic heart failure.  His echo was from [facility] do not quantify his ejection fraction.  Continue current medical treatment.  He is euvolemic today.

## 2023-04-13 NOTE — Progress Notes (Signed)
Subjective:  Patient ID: Brett Marquez, male    DOB: 1981-02-01  Age: 42 y.o. 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   Patient reports overall he is doing okay at this time.  No chest pain or shortness of breath.  No swelling in the lower extremities.  He is compliant with current medical therapy including carvedilol, eplerenone, Entresto, sotalol.  Patient needs labs today.  Previously been in the prediabetic range.  Needs A1c.  Patient overall has not been happy with the recent care at the congenital heart disease clinic.  He feels like the care is not as good as it was when he was a child.  He states that he does not feel like his visits are frequent enough.  He recently had an ICD upgrade and did not even have a postoperative visit.  Will discuss alternatives in regards to getting the care he desires in regards to his congenital heart disease.  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   Congestive heart failure (HCC) 05/07/2014   Atypical atrial flutter (HCC) 08/10/2011   AV block, 2nd degree 08/10/2011   Biventricular implantable cardioverter-defibrillator in situ 08/10/2011    Social Hx   Social History   Socioeconomic History   Marital status: Married    Spouse name: Not on file   Number of children: Not on file   Years of education: Not on file   Highest education level: Some college, no degree  Occupational History   Not on file  Tobacco Use   Smoking  status: Never   Smokeless tobacco: Former  Building services engineer status: Never Used  Substance and Sexual Activity   Alcohol use: No   Drug use: No   Sexual activity: Yes  Other Topics Concern   Not on file  Social History Narrative   Not on file   Social Determinants of Health   Financial Resource Strain: Low Risk  (12/30/2022)   Overall Financial Resource Strain (CARDIA)    Difficulty of Paying Living Expenses: Not very hard  Food Insecurity: No Food Insecurity (12/30/2022)   Hunger Vital Sign    Worried About Running Out of Food in the Last Year: Never true    Ran Out of Food in the Last Year: Never true  Transportation Needs: No Transportation Needs (12/30/2022)   PRAPARE - Administrator, Civil Service (Medical): No    Lack of Transportation (Non-Medical): No  Physical Activity: Inactive (12/30/2022)   Exercise Vital Sign    Days of Exercise per Week: 7 days    Minutes of Exercise per Session: 0 min  Stress: No Stress Concern Present (12/30/2022)   [employer] of Occupational Health - Occupational Stress Questionnaire    Feeling of Stress : Not at all  Social Connections: Moderately Integrated (12/30/2022)   Social Connection and Isolation Panel [NHANES]    Frequency of Communication with Friends and Family: More than three times a week    Frequency of  Social Gatherings with Friends and Family: More than three times a week    Attends Religious Services: 1 to 4 times per year    Active Member of [employer] or Organizations: No    Attends Engineer, structural: Never    Marital Status: Married    Review of Systems Per HPI  Objective:  BP 123/87   Pulse 86   Temp 98.5 F (36.9 C)   Ht 6\' 1"  (1.854 m)   Wt 288 lb (130.6 kg)   SpO2 96%   BMI 38.00 kg/m      04/12/2023    2:27 PM 01/03/2023   11:24 AM 12/28/2022    4:30 AM  BP/Weight  Systolic BP 123 131 147  Diastolic BP 87 84 80  Wt. (Lbs) 288 281   BMI 38 kg/m2 37.07 kg/m2     Physical  Exam Vitals reviewed.  Constitutional:      General: He is not in acute distress.    Appearance: Normal appearance.  HENT:     Head: Normocephalic and atraumatic.  Cardiovascular:     Rate and Rhythm: Normal rate and regular rhythm.     Heart sounds: Murmur heard.  Pulmonary:     Effort: Pulmonary effort is normal.     Breath sounds: Normal breath sounds. No wheezing, rhonchi or rales.  Neurological:     Mental Status: He is alert.  Psychiatric:        Mood and Affect: Mood normal.        Behavior: Behavior normal.     Lab Results  Component Value Date   WBC 4.8 04/12/2023   HGB 15.0 04/12/2023   HCT 43.7 04/12/2023   PLT 146 (L) 04/12/2023   GLUCOSE 123 (H) 04/12/2023   CHOL 106 04/12/2023   TRIG 146 04/12/2023   HDL 26 (L) 04/12/2023   LDLCALC 54 04/12/2023   ALT 46 (H) 04/12/2023   AST 45 (H) 04/12/2023   NA 142 04/12/2023   K 4.2 04/12/2023   CL 103 04/12/2023   CREATININE 0.95 04/12/2023   BUN 7 04/12/2023   CO2 23 04/12/2023   INR 1.1 12/25/2022   HGBA1C 6.7 (H) 04/12/2023     Assessment & Plan:   Problem List Items Addressed This Visit       Cardiovascular and Mediastinum   Hypertension    BP fairly well-controlled.  Continue current medications.      Relevant Orders   CMP14+EGFR (Completed)   Microalbumin / creatinine urine ratio   D-TGA (dextro-transposition of great arteries)    Referring to [facility].      Relevant Orders   Ambulatory referral to Cardiology   Congestive heart failure (HCC)    Has severe systolic heart failure.  His echo was from [facility] do not quantify his ejection fraction.  Continue current medical treatment.  He is euvolemic today.      Relevant Orders   Ambulatory referral to Cardiology     Endocrine   Diabetes mellitus (HCC)    A1c returned at 6.7.  This is consistent with type 2 diabetes.  Starting SGLT2.      Relevant Medications   empagliflozin (JARDIANCE) 10 MG TABS tablet   Other Relevant Orders   Hemoglobin  A1c (Completed)     Hematopoietic and Hemostatic   Thrombocytopenia (HCC)   Relevant Orders   CBC (Completed)     Other   Dyslipidemia    Lipids are well-controlled.  Low HDL.  Will continue to  monitor closely.      Relevant Orders   Lipid panel (Completed)   Other Visit Diagnoses     Anemia, unspecified type       Relevant Orders   Iron, TIBC and Ferritin Panel (Completed)       Meds ordered this encounter  Medications   empagliflozin (JARDIANCE) 10 MG TABS tablet    Sig: Take 1 tablet (10 mg total) by mouth daily before breakfast.    Dispense:  90 tablet    Refill:  3    Follow-up:  3 months  Ayde Record Adriana Simas DO [facility]

## 2023-04-13 NOTE — Assessment & Plan Note (Signed)
A1c returned at 6.7.  This is consistent with type 2 diabetes.  Starting SGLT2.

## 2023-04-13 NOTE — Assessment & Plan Note (Signed)
Lipids are well-controlled.  Low HDL.  Will continue to monitor closely.

## 2023-04-13 NOTE — Telephone Encounter (Signed)
Brett Sams, DO     He can do it at [facility] any time.

## 2023-04-14 ENCOUNTER — Other Ambulatory Visit: Payer: Self-pay

## 2023-04-14 DIAGNOSIS — R7989 Other specified abnormal findings of blood chemistry: Secondary | ICD-10-CM

## 2023-05-12 ENCOUNTER — Other Ambulatory Visit: Payer: Self-pay | Admitting: Family Medicine

## 2023-07-14 ENCOUNTER — Ambulatory Visit: Payer: Medicare Other | Admitting: Family Medicine

## 2023-07-14 VITALS — BP 106/73 | HR 81 | Temp 97.5°F | Ht 73.0 in | Wt 281.0 lb

## 2023-07-14 DIAGNOSIS — I10 Essential (primary) hypertension: Secondary | ICD-10-CM

## 2023-07-14 DIAGNOSIS — I5022 Chronic systolic (congestive) heart failure: Secondary | ICD-10-CM | POA: Diagnosis not present

## 2023-07-14 DIAGNOSIS — E119 Type 2 diabetes mellitus without complications: Secondary | ICD-10-CM | POA: Diagnosis not present

## 2023-07-14 DIAGNOSIS — Z7984 Long term (current) use of oral hypoglycemic drugs: Secondary | ICD-10-CM | POA: Diagnosis not present

## 2023-07-14 NOTE — Patient Instructions (Signed)
Follow up in 3 months.  Call with concerns.  Take care  Dr. Adriana Simas

## 2023-07-17 NOTE — Assessment & Plan Note (Signed)
At goal. Continue current medications. 

## 2023-07-17 NOTE — Progress Notes (Signed)
Subjective:  Patient ID: Brett Marquez, male    DOB: Jun 17, 1981  Age: 42 y.o. 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    Patient reports that he is feeling well. Issues with his daughter have essentially resolved. He is very relieved.   He is pleased with [facility]. He is scheduled for a stress test. [facility] increased Coreg and Entresto. BP well controlled here today.   Denies chest pain, SOB, LE edema.  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   Congestive heart failure (HCC) 05/07/2014   Atypical atrial flutter (HCC) 08/10/2011   AV block, 2nd degree 08/10/2011   Biventricular implantable cardioverter-defibrillator in situ 08/10/2011    Social Hx   Social History   Socioeconomic History   Marital status: Married    Spouse name: Not on file   Number of children: Not on file   Years of education: Not on file   Highest education level: Some college, no degree  Occupational History   Not on file  Tobacco Use   Smoking status: Never   Smokeless tobacco: Former  Building services engineer status: Never Used  Substance and Sexual Activity   Alcohol use: No   Drug use: No   Sexual activity: Yes  Other Topics Concern   Not on file  Social History Narrative   Not on file   Social Determinants of Health   Financial Resource Strain: Low Risk  (12/30/2022)   Overall Financial Resource Strain (CARDIA)    Difficulty of Paying Living Expenses:  Not very hard  Food Insecurity: No Food Insecurity (12/30/2022)   Hunger Vital Sign    Worried About Running Out of Food in the Last Year: Never true    Ran Out of Food in the Last Year: Never true  Transportation Needs: No Transportation Needs (12/30/2022)   PRAPARE - Administrator, Civil Service (Medical): No    Lack of Transportation (Non-Medical): No  Physical Activity: Inactive (12/30/2022)   Exercise Vital Sign    Days of Exercise per Week: 7 days    Minutes of Exercise per Session: 0 min  Stress: No Stress Concern Present (12/30/2022)   [employer] of Occupational Health - Occupational Stress Questionnaire    Feeling of Stress : Not at all  Social Connections: Moderately Integrated (12/30/2022)   Social Connection and Isolation Panel [NHANES]    Frequency of Communication with Friends and Family: More than three times a week    Frequency of Social Gatherings with Friends and Family: More than three times a week    Attends Religious Services: 1 to 4 times per year    Active Member of [employer] or Organizations: No    Attends Banker Meetings: Never    Marital Status: Married    Review of Systems Per HPI  Objective:  BP 106/73   Pulse 81   Temp (!) 97.5 F (36.4 C)  Ht 6\' 1"  (1.854 m)   Wt 281 lb (127.5 kg)   SpO2 96%   BMI 37.07 kg/m      07/14/2023    3:16 PM 04/12/2023    2:27 PM 01/03/2023   11:24 AM  BP/Weight  Systolic BP 106 123 131  Diastolic BP 73 87 84  Wt. (Lbs) 281 288 281  BMI 37.07 kg/m2 38 kg/m2 37.07 kg/m2    Physical Exam Constitutional:      General: He is not in acute distress.    Appearance: Normal appearance.  HENT:     Head: Normocephalic and atraumatic.  Eyes:     General:        Right eye: No discharge.        Left eye: No discharge.     Conjunctiva/sclera: Conjunctivae normal.  Cardiovascular:     Rate and Rhythm: Normal rate and regular rhythm.  Pulmonary:     Effort: Pulmonary effort is normal.      Breath sounds: Normal breath sounds.  Musculoskeletal:     Right lower leg: No edema.     Left lower leg: No edema.  Neurological:     Mental Status: He is alert.     Lab Results  Component Value Date   WBC 4.8 04/12/2023   HGB 15.0 04/12/2023   HCT 43.7 04/12/2023   PLT 146 (L) 04/12/2023   GLUCOSE 123 (H) 04/12/2023   CHOL 106 04/12/2023   TRIG 146 04/12/2023   HDL 26 (L) 04/12/2023   LDLCALC 54 04/12/2023   ALT 46 (H) 04/12/2023   AST 45 (H) 04/12/2023   NA 142 04/12/2023   K 4.2 04/12/2023   CL 103 04/12/2023   CREATININE 0.95 04/12/2023   BUN 7 04/12/2023   CO2 23 04/12/2023   INR 1.1 12/25/2022   HGBA1C 6.7 (H) 04/12/2023     Assessment & Plan:   Problem List Items Addressed This Visit       Cardiovascular and Mediastinum   Congestive heart failure (HCC) - Primary    Euvolemic today. Continue current medications.      Relevant Medications   aspirin EC 81 MG tablet   Hypertension    At goal. Continue current medications.      Relevant Medications   aspirin EC 81 MG tablet     Endocrine   Diabetes mellitus (HCC)    A1c at goal. Continue Jardiance.       Relevant Medications   aspirin EC 81 MG tablet    Follow-up:  3 months  Elle Vezina Adriana Simas DO [facility]

## 2023-07-17 NOTE — Assessment & Plan Note (Signed)
Euvolemic today. Continue current medications.  

## 2023-07-17 NOTE — Assessment & Plan Note (Signed)
A1c at goal. Continue Jardiance.

## 2023-08-09 ENCOUNTER — Telehealth: Payer: Self-pay | Admitting: Family Medicine

## 2023-08-09 MED ORDER — OMEPRAZOLE 20 MG PO CPDR: 20.0000 mg | DELAYED_RELEASE_CAPSULE | Freq: Every day | ORAL | 1 refills | Status: DC

## 2023-08-09 NOTE — Telephone Encounter (Signed)
Refill on  omeprazole (PRILOSEC) 20 MG capsule  [employer]

## 2023-10-13 ENCOUNTER — Ambulatory Visit (INDEPENDENT_AMBULATORY_CARE_PROVIDER_SITE_OTHER): Payer: Medicare Other | Admitting: Family Medicine

## 2023-10-13 DIAGNOSIS — E785 Hyperlipidemia, unspecified: Secondary | ICD-10-CM

## 2023-10-13 DIAGNOSIS — I10 Essential (primary) hypertension: Secondary | ICD-10-CM

## 2023-10-13 DIAGNOSIS — I5022 Chronic systolic (congestive) heart failure: Secondary | ICD-10-CM

## 2023-10-13 DIAGNOSIS — D696 Thrombocytopenia, unspecified: Secondary | ICD-10-CM | POA: Diagnosis not present

## 2023-10-13 DIAGNOSIS — E119 Type 2 diabetes mellitus without complications: Secondary | ICD-10-CM

## 2023-10-13 MED ORDER — EPLERENONE 50 MG PO TABS: 50.0000 mg | ORAL_TABLET | Freq: Every day | ORAL | 1 refills | Status: DC

## 2023-10-13 MED ORDER — CARVEDILOL 12.5 MG PO TABS: 12.5000 mg | ORAL_TABLET | Freq: Two times a day (BID) | ORAL | 3 refills | Status: DC

## 2023-10-13 MED ORDER — MAGNESIUM OXIDE 400 MG PO TABS: 400.0000 mg | ORAL_TABLET | Freq: Every day | ORAL | 1 refills | Status: DC

## 2023-10-13 NOTE — Patient Instructions (Signed)
Labs 7-10 days after starting new dose of Eplerenone.  I have sent in the medications for you.  Follow up in 3 months.

## 2023-10-14 ENCOUNTER — Ambulatory Visit: Payer: Medicare Other | Admitting: Family Medicine

## 2023-10-16 NOTE — Assessment & Plan Note (Signed)
Has upcoming cardiac CT.  Continue her medications.  Stable at this time.

## 2023-10-16 NOTE — Progress Notes (Signed)
Subjective:  Patient ID: Brett Marquez, male    DOB: 1981/07/21  Age: 42 y.o. 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   Patient states that he is feeling well. No chest pain or shortness of breath.  Recently seen by congenital heart disease clinic at [facility].  Coreg increased, eplerenone increased.  Patient states that he has not yet taken new doses of medication.  He is not using Lasix at this time.  Continues on Entresto and sotalol.  Also continues on Jardiance.  He states that for some reason his medications were not ready for him to pick up at the pharmacy.  I will fix this today.   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   Congestive heart failure (HCC) 05/07/2014   Atypical atrial flutter (HCC) 08/10/2011   AV block, 2nd degree 08/10/2011   Biventricular implantable cardioverter-defibrillator in situ 08/10/2011    Social Hx   Social History   Socioeconomic History   Marital status: Married    Spouse name: Not on file   Number of children: Not on file   Years of education: Not on file   Highest education level: Some college, no degree  Occupational History   Not on file  Tobacco Use   Smoking status: Never   Smokeless tobacco: Former  Building services engineer status: Never Used  Substance and Sexual Activity   Alcohol use: No   Drug use: No   Sexual activity: Yes  Other Topics Concern   Not on file  Social History Narrative   Not on file   Social Drivers of  Health   Financial Resource Strain: Low Risk  (10/12/2023)   Overall Financial Resource Strain (CARDIA)    Difficulty of Paying Living Expenses: Not hard at all  Food Insecurity: No Food Insecurity (10/12/2023)   Hunger Vital Sign    Worried About Running Out of Food in the Last Year: Never true    Ran Out of Food in the Last Year: Never true  Transportation Needs: No Transportation Needs (10/12/2023)   PRAPARE - Administrator, Civil Service (Medical): No    Lack of Transportation (Non-Medical): No  Physical Activity: Sufficiently Active (10/12/2023)   Exercise Vital Sign    Days of Exercise per Week: 6 days    Minutes of Exercise per Session: 30 min  Stress: Stress Concern Present (10/12/2023)   [employer] of Occupational Health - Occupational Stress Questionnaire    Feeling of Stress : To some extent  Social Connections: Moderately Integrated (10/12/2023)   Social Connection and Isolation Panel [NHANES]    Frequency of Communication with Friends and Family: More than three times a week    Frequency of Social Gatherings with Friends and Family: Twice a week    Attends Religious Services: 1 to 4 times per year    Active Member of [employer] or Organizations: No    Attends Banker Meetings: Never    Marital Status: Married  Review of Systems Per HPI  Objective:  BP 126/81   Pulse 85   Temp (!) 97.2 F (36.2 C)   Ht 6\' 1"  (1.854 m)   Wt 277 lb (125.6 kg)   SpO2 96%   BMI 36.55 kg/m      10/13/2023    2:31 PM 07/14/2023    3:16 PM 04/12/2023    2:27 PM  BP/Weight  Systolic BP 126 106 123  Diastolic BP 81 73 87  Wt. (Lbs) 277 281 288  BMI 36.55 kg/m2 37.07 kg/m2 38 kg/m2    Physical Exam Vitals and nursing note reviewed.  Constitutional:      General: He is not in acute distress.    Appearance: Normal appearance. He is obese.  HENT:     Head: Normocephalic and atraumatic.  Eyes:     General:        Right eye: No discharge.        Left  eye: No discharge.     Conjunctiva/sclera: Conjunctivae normal.  Cardiovascular:     Rate and Rhythm: Normal rate and regular rhythm.  Pulmonary:     Effort: Pulmonary effort is normal.     Breath sounds: Normal breath sounds. No wheezing, rhonchi or rales.  Neurological:     Mental Status: He is alert.  Psychiatric:        Mood and Affect: Mood normal.        Behavior: Behavior normal.     Lab Results  Component Value Date   WBC 4.8 04/12/2023   HGB 15.0 04/12/2023   HCT 43.7 04/12/2023   PLT 146 (L) 04/12/2023   GLUCOSE 123 (H) 04/12/2023   CHOL 106 04/12/2023   TRIG 146 04/12/2023   HDL 26 (L) 04/12/2023   LDLCALC 54 04/12/2023   ALT 46 (H) 04/12/2023   AST 45 (H) 04/12/2023   NA 142 04/12/2023   K 4.2 04/12/2023   CL 103 04/12/2023   CREATININE 0.95 04/12/2023   BUN 7 04/12/2023   CO2 23 04/12/2023   INR 1.1 12/25/2022   HGBA1C 6.7 (H) 04/12/2023     Assessment & Plan:   Problem List Items Addressed This Visit       Cardiovascular and Mediastinum   Hypertension   BP fairly well controlled. Sending new doses for carvedilol and eplerenone. No indications for Lasix at this time. Additionally, patient was found to be on the low end of normal regarding magnesium.  Rx for Mag-Ox sent.      Relevant Medications   eplerenone (INSPRA) 50 MG tablet   carvedilol (COREG) 12.5 MG tablet   Congestive heart failure (HCC)   Has upcoming cardiac CT.  Continue her medications.  Stable at this time.      Relevant Medications   eplerenone (INSPRA) 50 MG tablet   carvedilol (COREG) 12.5 MG tablet     Endocrine   Diabetes mellitus (HCC)   Labs ordered to assess.  Continue SGLT2.      Relevant Orders   Hemoglobin A1c   Microalbumin/Creatinine Ratio, Urine   CMP14+EGFR     Hematopoietic and Hemostatic   Thrombocytopenia (HCC)   Relevant Orders   CBC     Other   Dyslipidemia   Relevant Orders   Lipid panel    Meds ordered this encounter  Medications    DISCONTD: carvedilol (COREG) 12.5 MG tablet    Sig: Take 1 tablet (12.5 mg total) by mouth in the morning and at bedtime.  Dispense:  180 tablet    Refill:  3   DISCONTD: eplerenone (INSPRA) 50 MG tablet    Sig: Take 1 tablet (50 mg total) by mouth daily.    Dispense:  90 tablet    Refill:  1   DISCONTD: magnesium oxide (MAG-OX) 400 MG tablet    Sig: Take 1 tablet (400 mg total) by mouth daily.    Dispense:  90 tablet    Refill:  1   eplerenone (INSPRA) 50 MG tablet    Sig: Take 1 tablet (50 mg total) by mouth daily.    Dispense:  90 tablet    Refill:  1   magnesium oxide (MAG-OX) 400 MG tablet    Sig: Take 1 tablet (400 mg total) by mouth daily.    Dispense:  90 tablet    Refill:  1   carvedilol (COREG) 12.5 MG tablet    Sig: Take 1 tablet (12.5 mg total) by mouth in the morning and at bedtime.    Dispense:  180 tablet    Refill:  3    Follow-up:  3 months  Genevieve Ritzel Adriana Simas DO [facility]

## 2023-10-16 NOTE — Assessment & Plan Note (Signed)
Labs ordered to assess.  Continue SGLT2.

## 2023-10-16 NOTE — Assessment & Plan Note (Addendum)
BP fairly well controlled. Sending new doses for carvedilol and eplerenone. No indications for Lasix at this time. Additionally, patient was found to be on the low end of normal regarding magnesium.  Rx for Mag-Ox sent.

## 2023-10-18 ENCOUNTER — Encounter: Payer: Self-pay | Admitting: Family Medicine

## 2023-10-25 ENCOUNTER — Encounter: Payer: Self-pay | Admitting: Family Medicine

## 2023-10-26 LAB — MICROALBUMIN / CREATININE URINE RATIO
Creatinine, Urine: 61.3 mg/dL
Microalb/Creat Ratio: 10 mg/g{creat} (ref 0–29)
Microalbumin, Urine: 6.3 ug/mL

## 2023-10-26 LAB — CBC
Hematocrit: 46.9 % (ref 37.5–51.0)
Hemoglobin: 15.5 g/dL (ref 13.0–17.7)
MCH: 27.9 pg (ref 26.6–33.0)
MCHC: 33 g/dL (ref 31.5–35.7)
MCV: 85 fL (ref 79–97)
Platelets: 153 10*3/uL (ref 150–450)
RBC: 5.55 x10E6/uL (ref 4.14–5.80)
RDW: 13.6 % (ref 11.6–15.4)
WBC: 4.2 10*3/uL (ref 3.4–10.8)

## 2023-10-26 LAB — HEMOGLOBIN A1C
Est. average glucose Bld gHb Est-mCnc: 137 mg/dL
Hgb A1c MFr Bld: 6.4 % — ABNORMAL HIGH (ref 4.8–5.6)

## 2023-10-26 LAB — CMP14+EGFR
ALT: 52 [IU]/L — ABNORMAL HIGH (ref 0–44)
AST: 48 [IU]/L — ABNORMAL HIGH (ref 0–40)
Albumin: 4.4 g/dL (ref 4.1–5.1)
Alkaline Phosphatase: 79 [IU]/L (ref 44–121)
BUN/Creatinine Ratio: 11 (ref 9–20)
BUN: 11 mg/dL (ref 6–24)
Bilirubin Total: 0.9 mg/dL (ref 0.0–1.2)
CO2: 25 mmol/L (ref 20–29)
Calcium: 9.8 mg/dL (ref 8.7–10.2)
Chloride: 100 mmol/L (ref 96–106)
Creatinine, Ser: 1.03 mg/dL (ref 0.76–1.27)
Globulin, Total: 3 g/dL (ref 1.5–4.5)
Glucose: 122 mg/dL — ABNORMAL HIGH (ref 70–99)
Potassium: 4.4 mmol/L (ref 3.5–5.2)
Sodium: 140 mmol/L (ref 134–144)
Total Protein: 7.4 g/dL (ref 6.0–8.5)
eGFR: 92 mL/min/{1.73_m2} (ref >59)

## 2023-10-26 LAB — LIPID PANEL
Chol/HDL Ratio: 3.7 {ratio} (ref 0.0–5.0)
Cholesterol, Total: 93 mg/dL — ABNORMAL LOW (ref 100–199)
HDL: 25 mg/dL — ABNORMAL LOW (ref >39)
LDL Chol Calc (NIH): 46 mg/dL (ref 0–99)
Triglycerides: 117 mg/dL (ref 0–149)
VLDL Cholesterol Cal: 22 mg/dL (ref 5–40)

## 2023-10-27 ENCOUNTER — Encounter: Payer: Self-pay | Admitting: Family Medicine

## 2023-11-30 ENCOUNTER — Other Ambulatory Visit: Payer: Self-pay

## 2023-11-30 ENCOUNTER — Emergency Department (HOSPITAL_COMMUNITY)
Admission: EM | Admit: 2023-11-30 | Discharge: 2023-11-30 | Discharge status: Against Medical Advice | Attending: Emergency Medicine | Admitting: Emergency Medicine

## 2023-11-30 DIAGNOSIS — Z5321 Procedure and treatment not carried out due to patient leaving prior to being seen by health care provider: Secondary | ICD-10-CM | POA: Diagnosis not present

## 2023-11-30 DIAGNOSIS — X58XXXA Exposure to other specified factors, initial encounter: Secondary | ICD-10-CM | POA: Insufficient documentation

## 2023-11-30 DIAGNOSIS — S01419A Laceration without foreign body of unspecified cheek and temporomandibular area, initial encounter: Secondary | ICD-10-CM | POA: Insufficient documentation

## 2023-11-30 DIAGNOSIS — S0990XA Unspecified injury of head, initial encounter: Secondary | ICD-10-CM | POA: Diagnosis present

## 2023-11-30 NOTE — ED Triage Notes (Signed)
 Pt reports going to urgent care earlier today for laceration above left eye- small superficial partly still together with derma bond, steri strips no longer in place. Scant amount of blood oozing present, pt says everything came off when he showered after getting home from urgent care.

## 2023-12-01 ENCOUNTER — Encounter: Payer: Self-pay | Admitting: Family Medicine

## 2023-12-01 ENCOUNTER — Ambulatory Visit: Admitting: Family Medicine

## 2023-12-01 VITALS — BP 124/78 | HR 75 | Temp 97.7°F | Ht 73.0 in | Wt 278.0 lb

## 2023-12-01 DIAGNOSIS — S01112D Laceration without foreign body of left eyelid and periocular area, subsequent encounter: Secondary | ICD-10-CM | POA: Diagnosis not present

## 2023-12-01 DIAGNOSIS — S01119A Laceration without foreign body of unspecified eyelid and periocular area, initial encounter: Secondary | ICD-10-CM | POA: Insufficient documentation

## 2023-12-01 NOTE — Progress Notes (Signed)
 Subjective:  Patient ID: Brett Marquez, male    DOB: 1981/04/30  Age: 43 y.o. MRN: [id number]  CC:   Chief Complaint  Patient presents with   upper left lid laceration     From a metal bar from his lawn mower - this happened yesterday- went to [facility] care and was applied steri strips and glue , these came off with shower, tried going to ER but they were overwhelmed and decided not to stay, pt is cautious due to his heart condition     HPI:  43 year old male presents for evaluation of the above.  Patient states that he was working on his lawn more yesterday and in the process of doing so this hit by a metal bar.  He suffered a laceration to the lateral aspect of his left eyebrow.  He states that he went to urgent care and the area was glued.  He states that he subsequently went home and took a shower and the wound opened up.  This prompted him to go to the emergency room.  However, there was a long wait and he elected not to wait.  No current bleeding.  Patient states that he is here to ensure that this is going to heal well and not get infected.  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   Congestive heart failure (HCC) 05/07/2014   Atypical atrial flutter (HCC) 08/10/2011   AV block, 2nd degree 08/10/2011   Biventricular implantable cardioverter-defibrillator in situ 08/10/2011    Social Hx   Social History   Socioeconomic History   Marital status: Married    Spouse name: Not on file   Number of children: Not on file   Years of education: Not on file   Highest education level: Some college, no degree  Occupational History   Not on file  Tobacco Use   Smoking status: Never   Smokeless tobacco: Former  Building services engineer status: Never Used   Substance and Sexual Activity   Alcohol use: No   Drug use: No   Sexual activity: Yes  Other Topics Concern   Not on file  Social History Narrative   Not on file   Social Drivers of Health   Financial Resource Strain: Low Risk  (10/12/2023)   Overall Financial Resource Strain (CARDIA)    Difficulty of Paying Living Expenses: Not hard at all  Food Insecurity: No Food Insecurity (10/12/2023)   Hunger Vital Sign    Worried About Running Out of Food in the Last Year: Never true    Ran Out of Food in the Last Year: Never true  Transportation Needs: No Transportation Needs (10/12/2023)   PRAPARE - Administrator, Civil Service (Medical): No    Lack of Transportation (Non-Medical): No  Physical Activity: Sufficiently Active (10/12/2023)   Exercise Vital Sign    Days of Exercise per Week: 6 days    Minutes of Exercise per Session: 30 min  Stress: Stress Concern Present (10/12/2023)   [employer] of Occupational Health - Occupational Stress Questionnaire    Feeling of Stress : To some extent  Social Connections: Moderately Integrated (10/12/2023)   Social Connection and Isolation Panel [NHANES]    Frequency of Communication with Friends and Family: More than three times a  week    Frequency of Social Gatherings with Friends and Family: Twice a week    Attends Religious Services: 1 to 4 times per year    Active Member of [employer] or Organizations: No    Attends Engineer, structural: Not on file    Marital Status: Married    Review of Systems Per HPI  Objective:  BP 124/78   Pulse 75   Temp 97.7 F (36.5 C)   Ht 6\' 1"  (1.854 m)   Wt 278 lb (126.1 kg)   SpO2 97%   BMI 36.68 kg/m      12/01/2023    3:53 PM 11/30/2023    8:02 PM 11/30/2023    7:56 PM  BP/Weight  Systolic BP 124  045  Diastolic BP 78  73  Wt. (Lbs) 278 274 274  BMI 36.68 kg/m2 36.15 kg/m2 36.15 kg/m2    Physical Exam Constitutional:      General: He is not in acute distress. HENT:      Head:      Comments: Small well-approximated laceration at the labeled location.  No active bleeding. Neurological:     Mental Status: He is alert.     Lab Results  Component Value Date   WBC 4.2 10/25/2023   HGB 15.5 10/25/2023   HCT 46.9 10/25/2023   PLT 153 10/25/2023   GLUCOSE 122 (H) 10/25/2023   CHOL 93 (L) 10/25/2023   TRIG 117 10/25/2023   HDL 25 (L) 10/25/2023   LDLCALC 46 10/25/2023   ALT 52 (H) 10/25/2023   AST 48 (H) 10/25/2023   NA 140 10/25/2023   K 4.4 10/25/2023   CL 100 10/25/2023   CREATININE 1.03 10/25/2023   BUN 11 10/25/2023   CO2 25 10/25/2023   INR 1.1 12/25/2022   HGBA1C 6.4 (H) 10/25/2023     Assessment & Plan:  Laceration of left eyebrow, subsequent encounter Assessment & Plan: Well-approximated.  Advised supportive care and routine bathing/washing.     Follow-up:  Return if symptoms worsen or fail to improve.  Everlene Other DO [facility]

## 2023-12-01 NOTE — Assessment & Plan Note (Signed)
 Well-approximated.  Advised supportive care and routine bathing/washing.

## 2024-01-11 ENCOUNTER — Ambulatory Visit: Payer: Medicare Other | Admitting: Family Medicine

## 2024-01-11 DIAGNOSIS — M79606 Pain in leg, unspecified: Secondary | ICD-10-CM | POA: Insufficient documentation

## 2024-01-11 DIAGNOSIS — E1169 Type 2 diabetes mellitus with other specified complication: Secondary | ICD-10-CM

## 2024-01-11 DIAGNOSIS — I5022 Chronic systolic (congestive) heart failure: Secondary | ICD-10-CM | POA: Diagnosis not present

## 2024-01-11 DIAGNOSIS — E1159 Type 2 diabetes mellitus with other circulatory complications: Secondary | ICD-10-CM

## 2024-01-11 DIAGNOSIS — M79605 Pain in left leg: Secondary | ICD-10-CM

## 2024-01-11 DIAGNOSIS — E119 Type 2 diabetes mellitus without complications: Secondary | ICD-10-CM

## 2024-01-11 DIAGNOSIS — I10 Essential (primary) hypertension: Secondary | ICD-10-CM | POA: Diagnosis not present

## 2024-01-11 MED ORDER — EPLERENONE 50 MG PO TABS: 50.0000 mg | ORAL_TABLET | Freq: Every day | ORAL | 3 refills | Status: DC

## 2024-01-11 NOTE — Progress Notes (Signed)
 Subjective:  Patient ID: Brett Marquez, male    DOB: 1981-06-26  Age: 43 y.o. MRN: [id number]  CC: Follow-up   HPI:  43 year old male with a complex medical history including transposition of the great arteries presents for follow-up.  Patient reports that overall he is doing well.  Occasional shortness of breath with heavy lifting.  He is able to exert himself for his normal activities without difficulty.  He does note that he has had recent pain in his left lower leg and left foot.  Blood pressure is well-controlled.  Needs refill of eplerenone.  Following closely with cardiology.  Lipids at goal.  Type 2 diabetes at goal.  Most recent A1c 6.4.  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   Congestive heart failure (HCC) 05/07/2014   Atypical atrial flutter (HCC) 08/10/2011   AV block, 2nd degree 08/10/2011   Biventricular implantable cardioverter-defibrillator in situ 08/10/2011    Social Hx   Social History   Socioeconomic History   Marital status: Married    Spouse name: Not on file   Number of children: Not on file   Years of education: Not on file   Highest education level: Some college, no degree  Occupational History   Not on file  Tobacco Use   Smoking status: Never   Smokeless tobacco: Former  Building services engineer status: Never Used  Substance and Sexual Activity   Alcohol use: No   Drug use: No   Sexual activity: Yes  Other Topics Concern   Not on file  Social History Narrative   Not on file   Social Drivers of Health   Financial Resource Strain: Low Risk  (01/04/2024)   Received from [facility]   Overall Financial Resource Strain (CARDIA)    Difficulty of Paying Living Expenses: Not hard at all  Food Insecurity:  No Food Insecurity (01/04/2024)   [facility]   Hunger Vital Sign    Worried About Running Out of Food in the Last Year: Never true    Ran Out of Food in the Last Year: Never true  Transportation Needs: No Transportation Needs (01/04/2024)   Received from [facility]   PRAPARE - Transportation    Lack of Transportation (Medical): No    Lack of Transportation (Non-Medical): No  Physical Activity: Sufficiently Active (10/12/2023)   Exercise Vital Sign    Days of Exercise per Week: 6 days    Minutes of Exercise per Session: 30 min  Stress: Stress Concern Present (10/12/2023)   [employer] of Occupational Health - Occupational Stress Questionnaire    Feeling of Stress : To some extent  Social Connections: Moderately Integrated (10/12/2023)   Social Connection and Isolation Panel [NHANES]    Frequency of Communication with Friends and Family: More than three times a week    Frequency of Social Gatherings with Friends and Family: Twice a week    Attends Religious Services: 1 to 4 times per year    Active Member of [employer] or Organizations: No    Attends Engineer, structural: Not on file    Marital Status: Married    Review of Systems Per HPI  Objective:  BP 106/75   Pulse 79   Temp 98.1 F (36.7  C)   Ht 6\' 1"  (1.854 m)   Wt 276 lb (125.2 kg)   SpO2 96%   BMI 36.41 kg/m      01/11/2024    2:44 PM 12/01/2023    3:53 PM 11/30/2023    8:02 PM  BP/Weight  Systolic BP 106 124   Diastolic BP 75 78   Wt. (Lbs) 276 278 274  BMI 36.41 kg/m2 36.68 kg/m2 36.15 kg/m2    Physical Exam Vitals and nursing note reviewed.  Constitutional:      General: He is not in acute distress.    Appearance: Normal appearance. He is obese.  HENT:     Head: Normocephalic and atraumatic.  Cardiovascular:     Rate and Rhythm: Normal rate and regular rhythm.  Pulmonary:     Effort: Pulmonary effort is normal.     Breath sounds: Normal breath sounds. No wheezing or rales.   Musculoskeletal:     Comments: Left foot with mild tenderness at the heel.  Neurological:     Mental Status: He is alert.  Psychiatric:        Mood and Affect: Mood normal.        Behavior: Behavior normal.     Lab Results  Component Value Date   WBC 4.2 10/25/2023   HGB 15.5 10/25/2023   HCT 46.9 10/25/2023   PLT 153 10/25/2023   GLUCOSE 122 (H) 10/25/2023   CHOL 93 (L) 10/25/2023   TRIG 117 10/25/2023   HDL 25 (L) 10/25/2023   LDLCALC 46 10/25/2023   ALT 52 (H) 10/25/2023   AST 48 (H) 10/25/2023   NA 140 10/25/2023   K 4.4 10/25/2023   CL 100 10/25/2023   CREATININE 1.03 10/25/2023   BUN 11 10/25/2023   CO2 25 10/25/2023   INR 1.1 12/25/2022   HGBA1C 6.4 (H) 10/25/2023     Assessment & Plan:  Hypertension, unspecified type Assessment & Plan: BP stable.  Eplerenone refilled.  Orders: -     Eplerenone; Take 1 tablet (50 mg total) by mouth daily.  Dispense: 90 tablet; Refill: 3  Chronic systolic congestive heart failure (HCC) Assessment & Plan: Euvolemic today.  Continue current medications.  Orders: -     Eplerenone; Take 1 tablet (50 mg total) by mouth daily.  Dispense: 90 tablet; Refill: 3  Type 2 diabetes mellitus without complication, without long-term current use of insulin (HCC) Assessment & Plan: Stable.  Continue Jardiance.   Pain of left lower extremity Assessment & Plan: Recommend watchful waiting at this time.  Patient will let me know if this continues to bother him.     Follow-up: 6 months  Kenneith Stief Debrah Fan DO [facility]

## 2024-01-11 NOTE — Assessment & Plan Note (Signed)
 Stable  Continue Jardiance

## 2024-01-11 NOTE — Assessment & Plan Note (Signed)
 BP stable.  Eplerenone refilled.

## 2024-01-11 NOTE — Assessment & Plan Note (Signed)
 Recommend watchful waiting at this time.  Patient will let me know if this continues to bother him.

## 2024-01-11 NOTE — Assessment & Plan Note (Signed)
Euvolemic today. Continue current medications.  

## 2024-01-11 NOTE — Patient Instructions (Signed)
Follow up in 6 months.  Take care  Dr. Wren Gallaga  

## 2024-01-19 ENCOUNTER — Encounter: Payer: Self-pay | Admitting: Family Medicine

## 2024-02-05 ENCOUNTER — Other Ambulatory Visit: Payer: Self-pay | Admitting: Family Medicine

## 2024-02-05 DIAGNOSIS — I10 Essential (primary) hypertension: Secondary | ICD-10-CM

## 2024-02-05 DIAGNOSIS — I5022 Chronic systolic (congestive) heart failure: Secondary | ICD-10-CM

## 2024-02-05 MED ORDER — FUROSEMIDE 20 MG PO TABS: 20.0000 mg | ORAL_TABLET | Freq: Every day | ORAL | 1 refills | Status: DC | PRN

## 2024-02-05 MED ORDER — MAGNESIUM OXIDE 400 MG PO TABS: 400.0000 mg | ORAL_TABLET | Freq: Two times a day (BID) | ORAL | 3 refills | Status: AC

## 2024-02-05 MED ORDER — SOTALOL HCL 160 MG PO TABS: 160.0000 mg | ORAL_TABLET | Freq: Two times a day (BID) | ORAL | 3 refills | Status: DC

## 2024-02-05 MED ORDER — BUSPIRONE HCL 7.5 MG PO TABS: 7.5000 mg | ORAL_TABLET | Freq: Two times a day (BID) | ORAL | 3 refills | Status: DC

## 2024-02-05 MED ORDER — ASPIRIN 81 MG PO TBEC: 81.0000 mg | DELAYED_RELEASE_TABLET | Freq: Every day | ORAL | 3 refills | Status: AC

## 2024-02-05 MED ORDER — OMEPRAZOLE 20 MG PO CPDR: 20.0000 mg | DELAYED_RELEASE_CAPSULE | Freq: Every day | ORAL | 3 refills | Status: DC

## 2024-02-05 MED ORDER — CARVEDILOL 12.5 MG PO TABS: 12.5000 mg | ORAL_TABLET | Freq: Two times a day (BID) | ORAL | 3 refills | Status: DC

## 2024-02-05 MED ORDER — ENTRESTO 97-103 MG PO TABS: 1.0000 | ORAL_TABLET | Freq: Two times a day (BID) | ORAL | 3 refills | Status: DC

## 2024-02-05 MED ORDER — EPLERENONE 50 MG PO TABS: 50.0000 mg | ORAL_TABLET | Freq: Every day | ORAL | 3 refills | Status: DC

## 2024-02-05 MED ORDER — EMPAGLIFLOZIN 10 MG PO TABS: 10.0000 mg | ORAL_TABLET | Freq: Every day | ORAL | 3 refills | Status: DC

## 2024-03-05 ENCOUNTER — Encounter: Payer: Self-pay | Admitting: Family Medicine

## 2024-07-06 ENCOUNTER — Ambulatory Visit: Payer: Self-pay

## 2024-07-06 NOTE — Telephone Encounter (Signed)
 FYI Only or Action Required?: FYI only for provider.  Patient was last seen in primary care on 01/11/2024 by Cook, Jayce G, DO.  Called Nurse Triage reporting Sore Throat.  Symptoms began several days ago.  Interventions attempted: Rest, hydration, or home remedies.  Symptoms are: gradually worsening.  Triage Disposition: See Physician Within 24 Hours  Patient/caregiver understands and will follow disposition?: Yes   Copied from CRM [id number]. Topic: Clinical - Red Word Triage >> Jul 06, 2024  9:06 AM Ivette P wrote: Red Word that prompted transfer to Nurse Triage: something stuck in throat.side of throat is white. Reason for Disposition  [1] Pus on tonsils (back of throat) AND [2] fever AND [3] swollen neck lymph nodes (glands)  Answer Assessment - Initial Assessment Questions Additional info: 1) grandbabies have oral thrush 2) Refused acute visit today due to other commitments. Plans to proceed to urgent care   1. ONSET: When did the throat start hurting? (Hours or days ago)      Several days ago started with pain, yesterday noted white patches and redness 2. SEVERITY: How bad is the sore throat? (Scale 1-10; mild, moderate or severe)     moderate 3. STREP EXPOSURE: Has there been any exposure to strep within the past week? If Yes, ask: What type of contact occurred?      unknown 4.  VIRAL SYMPTOMS: Are there any symptoms of a cold, such as a runny nose, cough, hoarse voice or red eyes?      Mild productive cough 5. FEVER: Do you have a fever? If Yes, ask: What is your temperature, how was it measured, and when did it start?     denies 6. PUS ON THE TONSILS: Is there pus on the tonsils in the back of your throat?     Left sided white patch with red lines 7. OTHER SYMPTOMS: Do you have any other symptoms? (e.g., difficulty breathing, headache, rash)     congestion 8. PREGNANCY: Is there any chance you are pregnant? When was your last menstrual  period?  Protocols used: Sore Throat-A-AH

## 2024-07-12 ENCOUNTER — Ambulatory Visit: Admitting: Family Medicine

## 2024-07-20 ENCOUNTER — Ambulatory Visit

## 2024-10-09 ENCOUNTER — Encounter: Payer: Self-pay | Admitting: Family Medicine

## 2024-10-09 ENCOUNTER — Ambulatory Visit (INDEPENDENT_AMBULATORY_CARE_PROVIDER_SITE_OTHER): Admitting: Family Medicine

## 2024-10-09 VITALS — BP 93/61 | HR 86 | Temp 97.7°F | Ht 73.0 in | Wt 244.0 lb

## 2024-10-09 DIAGNOSIS — I5022 Chronic systolic (congestive) heart failure: Secondary | ICD-10-CM

## 2024-10-09 DIAGNOSIS — Z7984 Long term (current) use of oral hypoglycemic drugs: Secondary | ICD-10-CM

## 2024-10-09 DIAGNOSIS — I110 Hypertensive heart disease with heart failure: Secondary | ICD-10-CM | POA: Diagnosis not present

## 2024-10-09 DIAGNOSIS — E785 Hyperlipidemia, unspecified: Secondary | ICD-10-CM | POA: Diagnosis not present

## 2024-10-09 DIAGNOSIS — E119 Type 2 diabetes mellitus without complications: Secondary | ICD-10-CM | POA: Diagnosis not present

## 2024-10-09 DIAGNOSIS — I10 Essential (primary) hypertension: Secondary | ICD-10-CM

## 2024-10-09 MED ORDER — SOTALOL HCL 160 MG PO TABS: 160.0000 mg | ORAL_TABLET | Freq: Two times a day (BID) | ORAL | 3 refills | Status: AC

## 2024-10-09 MED ORDER — OMEPRAZOLE 20 MG PO CPDR: 20.0000 mg | DELAYED_RELEASE_CAPSULE | Freq: Every day | ORAL | 3 refills | Status: AC

## 2024-10-09 MED ORDER — EPLERENONE 25 MG PO TABS: 25.0000 mg | ORAL_TABLET | Freq: Every day | ORAL | 3 refills | Status: AC

## 2024-10-09 MED ORDER — SACUBITRIL-VALSARTAN 49-51 MG PO TABS: 1.0000 | ORAL_TABLET | Freq: Two times a day (BID) | ORAL | 11 refills | Status: AC

## 2024-10-09 MED ORDER — CARVEDILOL 12.5 MG PO TABS: 12.5000 mg | ORAL_TABLET | Freq: Two times a day (BID) | ORAL | 3 refills | Status: AC

## 2024-10-09 MED ORDER — EMPAGLIFLOZIN 10 MG PO TABS: 10.0000 mg | ORAL_TABLET | Freq: Every day | ORAL | 3 refills | Status: AC

## 2024-10-09 NOTE — Patient Instructions (Signed)
 Meds sent in.  Follow up in 6 months.  Message with concerns.

## 2024-10-10 NOTE — Assessment & Plan Note (Signed)
 Euvolemic today.  No chest pain or shortness of breath.  Will continue current medical therapy with carvedilol , Jardiance , eplerenone , Entresto .  He is also on sotalol  for rhythm control.

## 2024-10-10 NOTE — Assessment & Plan Note (Signed)
A1c at goal. Continue Jardiance.

## 2024-10-10 NOTE — Assessment & Plan Note (Addendum)
 BP is very well-controlled.  No symptoms of hypotension.  Continue current medications.  Medications have been refilled today.

## 2024-10-10 NOTE — Progress Notes (Signed)
 "  Subjective:  Patient ID: Brett Marquez, male    DOB: [birth year]  Age: 44 y.o. MRN: [id number]  CC:   Chief Complaint  Patient presents with   Medication Management    Per insurance changes entresto  may no longer be covered  per pt reported    HPI:  44 year old male presents for follow-up.  Patient states that there has been some sort of insurance issue regarding his Entresto .  He needs this sent into another pharmacy.  Patient follows closely with cardiology given his significant cardiac history.  He is compliant with current medications.  BP stable.  No current symptoms of hypertension.  LDL very well-controlled.  Type 2 diabetes stable.  Last A1c 6.2 in July.  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    Social Hx   Social History   Socioeconomic History   Marital status: Married    Spouse name: Not on file   Number of children: Not on file   Years of education: Not on file   Highest education level: Some college, no degree  Occupational History   Not on file  Tobacco Use   Smoking status: Never   Smokeless tobacco: Former  Building Services Engineer status: Never Used  Substance and Sexual Activity   Alcohol use: No   Drug use: No   Sexual activity: Yes  Other Topics Concern   Not on file  Social History Narrative   Not on file   Social Drivers of Health   Tobacco Use: Medium Risk (10/09/2024)   Patient History    Smoking Tobacco Use: Never    Smokeless Tobacco Use: Former    Passive Exposure: Not on Actuary Strain: Low Risk (10/06/2024)   Overall Financial  Resource Strain (CARDIA)    Difficulty of Paying Living Expenses: Not hard at all  Food Insecurity: No Food Insecurity (10/06/2024)   Epic    Worried About Radiation Protection Practitioner of Food in the Last Year: Never true    Ran Out of Food in the Last Year: Never true  Transportation Needs: No Transportation Needs (10/06/2024)   Epic    Lack of Transportation (Medical): No    Lack of Transportation (Non-Medical): No  Physical Activity: Sufficiently Active (10/06/2024)   Exercise Vital Sign    Days of Exercise per Week: 5 days    Minutes of Exercise per Session: 30 min  Stress: No Stress Concern Present (10/06/2024)   [employer] of Occupational Health - Occupational Stress Questionnaire    Feeling of Stress: Not at all  Social Connections: Moderately Integrated (10/06/2024)   Social Connection and Isolation Panel    Frequency of Communication with Friends and Family: Twice a week    Frequency of Social Gatherings with Friends and Family: Once a week    Attends Religious Services: 1 to 4 times per year    Active Member of [employer] or Organizations: No    Attends Banker Meetings: Not on file    Marital Status: Married  Depression (PHQ2-9): Low Risk (10/09/2024)   Depression (PHQ2-9)    PHQ-2 Score: 0  Alcohol Screen: Low Risk (12/30/2022)  Alcohol Screen    Last Alcohol Screening Score (AUDIT): 0  Housing: Low Risk (10/06/2024)   Epic    Unable to Pay for Housing in the Last Year: No    Number of Times Moved in the Last Year: 0    Homeless in the Last Year: No  Utilities: Low Risk (01/04/2024)   [facility]   Utilities    Within the past 12 months, have you been unable to get utilities(heat, electricity) when it was really needed?: No  Health Literacy: Not on file    Review of Systems Per HPI  Objective:  BP 93/61   Pulse 86   Temp 97.7 F (36.5 C)   Ht 6' 1 (1.854 m)   Wt 244 lb (110.7 kg)   SpO2 98%   BMI 32.19 kg/m      10/09/2024   10:14 AM  01/11/2024    2:44 PM 12/01/2023    3:53 PM  BP/Weight  Systolic BP 93 106 124  Diastolic BP 61 75 78  Wt. (Lbs) 244 276 278  BMI 32.19 kg/m2 36.41 kg/m2 36.68 kg/m2    Physical Exam Constitutional:      General: He is not in acute distress.    Appearance: Normal appearance.  HENT:     Head: Normocephalic and atraumatic.  Eyes:     General:        Right eye: No discharge.        Left eye: No discharge.     Conjunctiva/sclera: Conjunctivae normal.  Cardiovascular:     Rate and Rhythm: Normal rate and regular rhythm.  Pulmonary:     Effort: Pulmonary effort is normal.     Breath sounds: Normal breath sounds.  Neurological:     Mental Status: He is alert.  Psychiatric:        Mood and Affect: Mood normal.        Behavior: Behavior normal.     Lab Results  Component Value Date   WBC 4.2 10/25/2023   HGB 15.5 10/25/2023   HCT 46.9 10/25/2023   PLT 153 10/25/2023   GLUCOSE 122 (H) 10/25/2023   CHOL 93 (L) 10/25/2023   TRIG 117 10/25/2023   HDL 25 (L) 10/25/2023   LDLCALC 46 10/25/2023   ALT 52 (H) 10/25/2023   AST 48 (H) 10/25/2023   NA 140 10/25/2023   K 4.4 10/25/2023   CL 100 10/25/2023   CREATININE 1.03 10/25/2023   BUN 11 10/25/2023   CO2 25 10/25/2023   INR 1.1 12/25/2022   HGBA1C 6.4 (H) 10/25/2023     Assessment & Plan:  Chronic systolic congestive heart failure (HCC) Assessment & Plan: Euvolemic today.  No chest pain or shortness of breath.  Will continue current medical therapy with carvedilol , Jardiance , eplerenone , Entresto .  He is also on sotalol  for rhythm control.  Orders: -     Sacubitril -Valsartan ; Take 1 tablet by mouth 2 (two) times daily.  Dispense: 60 tablet; Refill: 11 -     Eplerenone ; Take 1 tablet (25 mg total) by mouth daily.  Dispense: 90 tablet; Refill: 3 -     Empagliflozin ; Take 1 tablet (10 mg total) by mouth daily.  Dispense: 90 tablet; Refill: 3 -     Carvedilol ; Take 1 tablet (12.5 mg total) by mouth 2 (two) times daily.   Dispense: 180 tablet; Refill: 3  Hypertension, unspecified type Assessment & Plan: BP is very well-controlled.  No symptoms of hypotension.  Continue current  medications.  Medications have been refilled today.   Type 2 diabetes mellitus without complication, without long-term current use of insulin (HCC) Assessment & Plan: A1c at goal.  Continue Jardiance .  Orders: -     Empagliflozin ; Take 1 tablet (10 mg total) by mouth daily.  Dispense: 90 tablet; Refill: 3  Dyslipidemia Assessment & Plan: LDL at goal.   Other orders -     Omeprazole ; Take 1 capsule (20 mg total) by mouth daily.  Dispense: 90 capsule; Refill: 3 -     Sotalol  HCl; Take 1 tablet (160 mg total) by mouth 2 (two) times daily.  Dispense: 180 tablet; Refill: 3    Follow-up: 6 months  Rakesh Dutko Bluford DO [facility] "

## 2024-10-10 NOTE — Assessment & Plan Note (Signed)
 LDL at goal

## 2024-10-12 ENCOUNTER — Ambulatory Visit

## 2025-01-07 ENCOUNTER — Ambulatory Visit: Admitting: Family Medicine
